# Patient Record
Sex: Female | Born: 1935 | Race: White | Hispanic: No | Smoking: Never smoker
Health system: Southern US, Community
[De-identification: ages and names within clinical notes are randomized; demographics above are authoritative.]

## PROBLEM LIST (undated history)

## (undated) DIAGNOSIS — I10 Essential (primary) hypertension: Secondary | ICD-10-CM

## (undated) DIAGNOSIS — I519 Heart disease, unspecified: Secondary | ICD-10-CM

## (undated) DIAGNOSIS — I951 Orthostatic hypotension: Secondary | ICD-10-CM

## (undated) DIAGNOSIS — R0609 Other forms of dyspnea: Secondary | ICD-10-CM

## (undated) DIAGNOSIS — I340 Nonrheumatic mitral (valve) insufficiency: Secondary | ICD-10-CM

## (undated) DIAGNOSIS — I2510 Atherosclerotic heart disease of native coronary artery without angina pectoris: Secondary | ICD-10-CM

## (undated) DIAGNOSIS — R0600 Dyspnea, unspecified: Secondary | ICD-10-CM

## (undated) DIAGNOSIS — E785 Hyperlipidemia, unspecified: Secondary | ICD-10-CM

## (undated) HISTORY — DX: Nonrheumatic mitral (valve) insufficiency: I34.0

## (undated) HISTORY — DX: Orthostatic hypotension: I95.1

## (undated) HISTORY — DX: Heart disease, unspecified: I51.9

## (undated) HISTORY — DX: Dyspnea, unspecified: R06.00

## (undated) HISTORY — DX: Other forms of dyspnea: R06.09

## (undated) HISTORY — DX: Atherosclerotic heart disease of native coronary artery without angina pectoris: I25.10

## (undated) HISTORY — PX: COLONOSCOPY W/ POLYPECTOMY: SHX1380

## (undated) HISTORY — PX: ECTROPION REPAIR: SHX357

## (undated) HISTORY — DX: Hyperlipidemia, unspecified: E78.5

## (undated) HISTORY — PX: CARDIAC CATHETERIZATION: SHX172

---

## 2000-06-20 ENCOUNTER — Encounter: Admission: RE | Admit: 2000-06-20 | Discharge: 2000-06-20 | Payer: Self-pay | Admitting: Internal Medicine

## 2000-06-20 ENCOUNTER — Encounter: Payer: Self-pay | Admitting: Internal Medicine

## 2000-10-14 ENCOUNTER — Emergency Department (HOSPITAL_COMMUNITY): Admission: EM | Admit: 2000-10-14 | Discharge: 2000-10-14 | Payer: Self-pay

## 2000-10-14 ENCOUNTER — Encounter: Payer: Self-pay | Admitting: Emergency Medicine

## 2000-12-07 ENCOUNTER — Ambulatory Visit (HOSPITAL_COMMUNITY): Admission: RE | Admit: 2000-12-07 | Discharge: 2000-12-07 | Payer: Self-pay | Admitting: Ophthalmology

## 2001-07-21 ENCOUNTER — Encounter (INDEPENDENT_AMBULATORY_CARE_PROVIDER_SITE_OTHER): Payer: Self-pay | Admitting: *Deleted

## 2001-07-21 ENCOUNTER — Observation Stay (HOSPITAL_COMMUNITY): Admission: EM | Admit: 2001-07-21 | Discharge: 2001-07-22 | Payer: Self-pay | Admitting: Emergency Medicine

## 2002-04-30 ENCOUNTER — Ambulatory Visit (HOSPITAL_COMMUNITY): Admission: RE | Admit: 2002-04-30 | Discharge: 2002-04-30 | Payer: Self-pay | Admitting: Gastroenterology

## 2002-04-30 ENCOUNTER — Encounter (INDEPENDENT_AMBULATORY_CARE_PROVIDER_SITE_OTHER): Payer: Self-pay

## 2002-10-24 ENCOUNTER — Ambulatory Visit (HOSPITAL_COMMUNITY): Admission: RE | Admit: 2002-10-24 | Discharge: 2002-10-24 | Payer: Self-pay

## 2004-03-25 ENCOUNTER — Inpatient Hospital Stay (HOSPITAL_COMMUNITY): Admission: EM | Admit: 2004-03-25 | Discharge: 2004-03-30 | Payer: Self-pay | Admitting: Emergency Medicine

## 2004-09-14 ENCOUNTER — Ambulatory Visit: Payer: Self-pay | Admitting: Family Medicine

## 2004-10-15 ENCOUNTER — Ambulatory Visit: Payer: Self-pay | Admitting: Family Medicine

## 2004-11-06 ENCOUNTER — Ambulatory Visit: Payer: Self-pay | Admitting: Family Medicine

## 2004-11-17 ENCOUNTER — Ambulatory Visit: Payer: Self-pay | Admitting: Cardiology

## 2004-12-09 ENCOUNTER — Ambulatory Visit: Payer: Self-pay | Admitting: Cardiology

## 2004-12-21 ENCOUNTER — Ambulatory Visit: Payer: Self-pay | Admitting: Family Medicine

## 2005-03-23 ENCOUNTER — Ambulatory Visit: Payer: Self-pay | Admitting: Family Medicine

## 2005-05-24 ENCOUNTER — Ambulatory Visit: Payer: Self-pay | Admitting: Family Medicine

## 2005-07-06 ENCOUNTER — Ambulatory Visit: Payer: Self-pay | Admitting: Cardiology

## 2005-07-15 ENCOUNTER — Ambulatory Visit: Payer: Self-pay | Admitting: Cardiology

## 2005-07-26 ENCOUNTER — Ambulatory Visit: Payer: Self-pay | Admitting: Family Medicine

## 2005-08-02 ENCOUNTER — Ambulatory Visit: Payer: Self-pay | Admitting: Family Medicine

## 2005-09-08 ENCOUNTER — Ambulatory Visit: Payer: Self-pay | Admitting: Family Medicine

## 2005-10-26 ENCOUNTER — Encounter (INDEPENDENT_AMBULATORY_CARE_PROVIDER_SITE_OTHER): Payer: Self-pay | Admitting: Specialist

## 2005-10-26 ENCOUNTER — Ambulatory Visit (HOSPITAL_COMMUNITY): Admission: RE | Admit: 2005-10-26 | Discharge: 2005-10-26 | Payer: Self-pay | Admitting: Gastroenterology

## 2005-11-09 ENCOUNTER — Ambulatory Visit: Payer: Self-pay | Admitting: Family Medicine

## 2006-02-14 ENCOUNTER — Ambulatory Visit: Payer: Self-pay | Admitting: Family Medicine

## 2006-03-15 ENCOUNTER — Ambulatory Visit: Payer: Self-pay | Admitting: Family Medicine

## 2006-03-18 ENCOUNTER — Ambulatory Visit: Payer: Self-pay | Admitting: Family Medicine

## 2006-04-05 ENCOUNTER — Ambulatory Visit: Payer: Self-pay | Admitting: Family Medicine

## 2006-05-03 ENCOUNTER — Ambulatory Visit: Payer: Self-pay | Admitting: Cardiology

## 2006-05-11 ENCOUNTER — Ambulatory Visit: Payer: Self-pay | Admitting: Family Medicine

## 2006-05-19 ENCOUNTER — Ambulatory Visit: Payer: Self-pay

## 2006-05-25 ENCOUNTER — Ambulatory Visit: Payer: Self-pay | Admitting: Family Medicine

## 2006-06-16 ENCOUNTER — Ambulatory Visit: Payer: Self-pay | Admitting: Family Medicine

## 2006-06-16 ENCOUNTER — Ambulatory Visit: Payer: Self-pay | Admitting: Cardiology

## 2006-06-16 ENCOUNTER — Encounter: Payer: Self-pay | Admitting: Cardiology

## 2006-06-16 ENCOUNTER — Inpatient Hospital Stay (HOSPITAL_COMMUNITY): Admission: EM | Admit: 2006-06-16 | Discharge: 2006-06-17 | Payer: Self-pay | Admitting: Emergency Medicine

## 2006-06-22 ENCOUNTER — Ambulatory Visit: Payer: Self-pay | Admitting: Family Medicine

## 2006-06-30 ENCOUNTER — Ambulatory Visit: Payer: Self-pay | Admitting: Cardiology

## 2006-07-07 ENCOUNTER — Ambulatory Visit: Payer: Self-pay

## 2006-07-07 ENCOUNTER — Ambulatory Visit: Payer: Self-pay | Admitting: Cardiology

## 2006-07-20 ENCOUNTER — Ambulatory Visit: Payer: Self-pay | Admitting: Internal Medicine

## 2006-08-03 ENCOUNTER — Ambulatory Visit: Payer: Self-pay | Admitting: Family Medicine

## 2006-09-26 ENCOUNTER — Ambulatory Visit: Payer: Self-pay | Admitting: Family Medicine

## 2006-11-02 ENCOUNTER — Ambulatory Visit: Payer: Self-pay | Admitting: Cardiology

## 2006-11-02 LAB — CONVERTED CEMR LAB
BUN: 12 mg/dL (ref 6–23)
GFR calc non Af Amer: 64 mL/min
Potassium: 3.7 meq/L (ref 3.5–5.1)

## 2006-11-21 ENCOUNTER — Ambulatory Visit: Payer: Self-pay | Admitting: Family Medicine

## 2006-12-02 ENCOUNTER — Ambulatory Visit: Payer: Self-pay | Admitting: Family Medicine

## 2007-01-06 ENCOUNTER — Ambulatory Visit: Payer: Self-pay | Admitting: Family Medicine

## 2007-02-06 ENCOUNTER — Ambulatory Visit: Payer: Self-pay | Admitting: Family Medicine

## 2007-02-10 ENCOUNTER — Encounter: Admission: RE | Admit: 2007-02-10 | Discharge: 2007-02-10 | Payer: Self-pay | Admitting: Otolaryngology

## 2007-02-15 ENCOUNTER — Ambulatory Visit: Payer: Self-pay | Admitting: Family Medicine

## 2007-02-28 ENCOUNTER — Ambulatory Visit: Payer: Self-pay | Admitting: Family Medicine

## 2007-03-07 ENCOUNTER — Ambulatory Visit: Payer: Self-pay | Admitting: Family Medicine

## 2007-04-02 ENCOUNTER — Emergency Department (HOSPITAL_COMMUNITY): Admission: EM | Admit: 2007-04-02 | Discharge: 2007-04-02 | Payer: Self-pay | Admitting: Emergency Medicine

## 2007-04-04 ENCOUNTER — Ambulatory Visit: Payer: Self-pay | Admitting: Family Medicine

## 2007-04-18 ENCOUNTER — Ambulatory Visit: Payer: Self-pay | Admitting: Cardiology

## 2007-07-18 ENCOUNTER — Ambulatory Visit: Payer: Self-pay | Admitting: Cardiology

## 2007-11-27 ENCOUNTER — Encounter: Admission: RE | Admit: 2007-11-27 | Discharge: 2007-11-27 | Payer: Self-pay | Admitting: Orthopedic Surgery

## 2009-02-18 ENCOUNTER — Ambulatory Visit (HOSPITAL_COMMUNITY): Admission: RE | Admit: 2009-02-18 | Discharge: 2009-02-18 | Payer: Self-pay | Admitting: Orthopedic Surgery

## 2009-08-27 ENCOUNTER — Encounter: Admission: RE | Admit: 2009-08-27 | Discharge: 2009-10-09 | Payer: Self-pay | Admitting: Orthopedic Surgery

## 2009-10-07 ENCOUNTER — Emergency Department (HOSPITAL_COMMUNITY): Admission: EM | Admit: 2009-10-07 | Discharge: 2009-10-07 | Payer: Self-pay | Admitting: Emergency Medicine

## 2010-01-01 ENCOUNTER — Encounter (INDEPENDENT_AMBULATORY_CARE_PROVIDER_SITE_OTHER): Payer: Self-pay | Admitting: *Deleted

## 2010-03-20 DIAGNOSIS — R0609 Other forms of dyspnea: Secondary | ICD-10-CM | POA: Insufficient documentation

## 2010-03-20 DIAGNOSIS — I951 Orthostatic hypotension: Secondary | ICD-10-CM

## 2010-03-20 DIAGNOSIS — E785 Hyperlipidemia, unspecified: Secondary | ICD-10-CM | POA: Insufficient documentation

## 2010-03-20 DIAGNOSIS — I080 Rheumatic disorders of both mitral and aortic valves: Secondary | ICD-10-CM | POA: Insufficient documentation

## 2010-03-20 DIAGNOSIS — I2510 Atherosclerotic heart disease of native coronary artery without angina pectoris: Secondary | ICD-10-CM

## 2010-03-20 DIAGNOSIS — I5022 Chronic systolic (congestive) heart failure: Secondary | ICD-10-CM

## 2010-03-20 DIAGNOSIS — R0989 Other specified symptoms and signs involving the circulatory and respiratory systems: Secondary | ICD-10-CM

## 2010-03-20 DIAGNOSIS — I252 Old myocardial infarction: Secondary | ICD-10-CM

## 2010-03-26 ENCOUNTER — Ambulatory Visit: Payer: Self-pay | Admitting: Cardiology

## 2010-03-30 ENCOUNTER — Encounter: Admission: RE | Admit: 2010-03-30 | Discharge: 2010-03-30 | Payer: Self-pay | Admitting: Gastroenterology

## 2010-06-03 ENCOUNTER — Ambulatory Visit (HOSPITAL_COMMUNITY): Admission: RE | Admit: 2010-06-03 | Discharge: 2010-06-03 | Payer: Self-pay | Admitting: Gastroenterology

## 2010-08-04 ENCOUNTER — Encounter
Admission: RE | Admit: 2010-08-04 | Discharge: 2010-10-08 | Payer: Self-pay | Source: Home / Self Care | Attending: Orthopedic Surgery | Admitting: Orthopedic Surgery

## 2010-10-11 ENCOUNTER — Encounter
Admission: RE | Admit: 2010-10-11 | Discharge: 2010-11-10 | Payer: Self-pay | Source: Home / Self Care | Attending: Orthopedic Surgery | Admitting: Orthopedic Surgery

## 2010-11-10 NOTE — Assessment & Plan Note (Signed)
Summary: f2y  Medications Added HYDRALAZINE HCL 10 MG TABS (HYDRALAZINE HCL) 1 two times a day ADVAIR DISKUS 250-50 MCG/DOSE AEPB (FLUTICASONE-SALMETEROL) 1 puff two times a day VITAMIN C 1000 MG TABS (ASCORBIC ACID) 1 tab two times a day FUROSEMIDE 20 MG TABS (FUROSEMIDE) 1 tab as needed PREDNISONE 5 MG TABS (PREDNISONE) 1 tab once daily ANTIVERT 25 MG TABS (MECLIZINE HCL) 1 tab as needed      Allergies Added: ! PCN ! DEMEROL  Visit Type:  2 yr f/u Primary Provider:  Joette Catching  CC:  pt states when she lays down at night she has some RUQ pain....edema/knees....denies any sob.  History of Present Illness: [name] returns today with her daughter after nearly a three-year absence from the practice.  She's having no symptoms of angina or ischemia. She denies orthopnea PND or edema. She has some mild dyspnea on exertion but really doesn't push herself a hard. She is undergoing gastroenterology evaluation the present time because of abdominal pain. She denies any bleeding or melena.  She has run out of hydralazine. She has a previous ejection fraction around 35%.  Current Medications (verified): 1)  Carvedilol 3.125 Mg Tabs (Carvedilol) .Marland Kitchen.. 1 Tab Two Times A Day 2)  Hydralazine Hcl 10 Mg Tabs (Hydralazine Hcl) .Marland Kitchen.. 1 Tab Three Times A Day 3)  Caltrate 600+d 600-400 Mg-Unit Tabs (Calcium Carbonate-Vitamin D) .Marland Kitchen.. 1 Tab Two Times A Day 4)  Advair Diskus 250-50 Mcg/dose Aepb (Fluticasone-Salmeterol) .Marland Kitchen.. 1 Puff Two Times A Day 5)  Vitamin C 1000 Mg Tabs (Ascorbic Acid) .Marland Kitchen.. 1 Tab Two Times A Day 6)  Furosemide 20 Mg Tabs (Furosemide) .Marland Kitchen.. 1 Tab As Needed 7)  Prednisone 5 Mg Tabs (Prednisone) .Marland Kitchen.. 1 Tab Once Daily 8)  Antivert 25 Mg Tabs (Meclizine Hcl) .Marland Kitchen.. 1 Tab As Needed  Allergies (verified): 1)  ! Pcn 2)  ! Demerol  Past History:  Past Medical History: Last updated: Mar 22, 2010  1. Coronary disease status post non-ST segment elevation MI in 2005.       She has a  bare-metal stent in the right coronary artery.  She has       residual circumflex disease being treated medically.  She is having       no angina or anginal equivalence.   2. Chronic systolic dysfunction.  EF 35% to 45%.   3. Chronic dyspnea on exertion.   4. History of orthostatic hypotension.   5. Hyperlipidemia.   6. Multiple drug allergies including particularly antihypertensives.   7. Moderate mitral regurgitation.      Past Surgical History: Last updated: March 22, 2010 Esophagogastroduodenoscopy. Colonoscopy with polypectomy. Esophagogastroduodenoscopy with biopsy. Ectropion repair of the left lower eyelid  Family History: Last updated: 2010-03-22 Mother died of a ruptured appendix at age 63.  Father died  of cancer at age 28.  She had three brothers, one died at age 46 months, one  died at 54 of lung cancer and one was alive and well.  She has two sisters,  one suffered a stroke in the other has COPD.  Social History: Last updated: Mar 22, 2010  She lives in [place] with her daughter.  She is a retired  Tree surgeon.  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  She has never  smoked cigarettes, does not use alcohol or drugs.  She does not routinely  exercise but is active around her house and yard.  She tries to adhere to a  low fat diet.  Review of Systems  negative other history of present illness  Vital Signs:  Patient profile:   77 year old female Height:      64.5 inches Weight:      133 pounds BMI:     22.56 Pulse rate:   64 / minute Pulse rhythm:   irregular BP sitting:   128 / 82  (left arm) Cuff size:   large  Vitals Entered By: Danielle Rankin, CMA (March 26, 2010 4:43 PM)  Physical Exam  General:  elderly, no acute distress Head:  normocephalic and atraumatic Eyes:  PERRLA/EOM intact; conjunctiva and lids normal. Neck:  Neck supple, no JVD. No masses, thyromegaly or abnormal cervical nodes. Chest Juanantonio Stolar:  no deformities or breast masses noted Lungs:   Clear bilaterally to auscultation and percussion. Heart:  PMI nondisplaced, soft S1-S2, no gallop. His soft systolic murmur at the apex. Msk:  decreased ROM.  decreased ROM.   Pulses:  pulses normal in all 4 extremities Extremities:  No clubbing or cyanosis. Neurologic:  Alert and oriented x 3. Skin:  Intact without lesions or rashes. Psych:  Normal affect.   EKG  Procedure date:  03/26/2010  Findings:      normal sinus rhythm, nonspecific interventricular conduction delay, left axis deviation, previous and her Paulina Muchmore infarct, no change.  Impression & Recommendations:  Problem # 1:  CAD, NATIVE VESSEL (ICD-414.01) Assessment Unchanged  Continue to treat with medical therapy. The following medications were removed from the medication list:    Aspirin 81 Mg Tbec (Aspirin) .Marland Kitchen... Take one tablet by mouth daily Her updated medication list for this problem includes:    Carvedilol 3.125 Mg Tabs (Carvedilol) .Marland Kitchen... 1 tab two times a day  The following medications were removed from the medication list:    Aspirin 81 Mg Tbec (Aspirin) .Marland Kitchen... Take one tablet by mouth daily Her updated medication list for this problem includes:    Carvedilol 3.125 Mg Tabs (Carvedilol) .Marland Kitchen... 1 tab two times a day  Orders: EKG w/ Interpretation (93000)  Problem # 2:  SYSTOLIC HEART FAILURE, CHRONIC (ICD-428.22)  I have renewed her hydralazine a low dose and only twice a day considering her age The following medications were removed from the medication list:    Hydrochlorothiazide 12.5 Mg Caps (Hydrochlorothiazide) .Marland Kitchen... 1 cap once daily    Aspirin 81 Mg Tbec (Aspirin) .Marland Kitchen... Take one tablet by mouth daily Her updated medication list for this problem includes:    Carvedilol 3.125 Mg Tabs (Carvedilol) .Marland Kitchen... 1 tab two times a day    Furosemide 20 Mg Tabs (Furosemide) .Marland Kitchen... 1 tab as needed  The following medications were removed from the medication list:    Hydrochlorothiazide 12.5 Mg Caps  (Hydrochlorothiazide) .Marland Kitchen... 1 cap once daily    Aspirin 81 Mg Tbec (Aspirin) .Marland Kitchen... Take one tablet by mouth daily Her updated medication list for this problem includes:    Carvedilol 3.125 Mg Tabs (Carvedilol) .Marland Kitchen... 1 tab two times a day    Furosemide 20 Mg Tabs (Furosemide) .Marland Kitchen... 1 tab as needed  Problem # 3:  MITRAL REGURGITATION, MODERATE (ICD-396.3) Assessment: Unchanged It sounds mild by exam. No signs of heart failure. Continue current medications.  Problem # 4:  ORTHOSTATIC HYPOTENSION (ICD-458.0) With her history of this, I will treat with very conservative dosages of carvedilol and hydralazine.  Problem # 5:  HYPERLIPIDEMIA (ICD-272.4)  The following medications were removed from the medication list:    Vytorin 10-40 Mg Tabs (Ezetimibe-simvastatin) .Marland Kitchen... 1 tab at bedtime  The following  medications were removed from the medication list:    Vytorin 10-40 Mg Tabs (Ezetimibe-simvastatin) .Marland Kitchen... 1 tab at bedtime  Patient Instructions: 1)  Your physician recommends that you schedule a follow-up appointment in: YEAR WITH DR Fidel Caggiano 2)  Your physician has recommended you make the following change in your medication: DECREASE HYDRALAZINE TO 10 MG two times a day Prescriptions: HYDRALAZINE HCL 10 MG TABS (HYDRALAZINE HCL) 1 two times a day  #60 x 11   Entered by:   Scherrie Bateman, LPN   Authorized by:   Gaylord Shih, MD, [facility]   Signed by:   Scherrie Bateman, LPN on 16/07/9603   Method used:   Faxed to ...       Hospital doctor (retail)       [address]       [place], [place]  [address]       Ph: [phone] or [phone]       Fax: [phone]   RxID:   [id number]

## 2010-11-10 NOTE — Miscellaneous (Signed)
Summary: med update  Clinical Lists Changes  Medications: Added new medication of CARVEDILOL 3.125 MG TABS (CARVEDILOL) 1 tab two times a day - Signed Added new medication of VYTORIN 10-40 MG TABS (EZETIMIBE-SIMVASTATIN) 1 tab at bedtime Added new medication of FOSAMAX 70 MG TABS (ALENDRONATE SODIUM) 1 tab weekly Added new medication of HYDROCHLOROTHIAZIDE 12.5 MG CAPS (HYDROCHLOROTHIAZIDE) 1 cap once daily Added new medication of ASPIRIN 81 MG TBEC (ASPIRIN) Take one tablet by mouth daily Added new medication of HYDRALAZINE HCL 10 MG TABS (HYDRALAZINE HCL) 1 tab three times a day Added new medication of CALTRATE 600+D 600-400 MG-UNIT TABS (CALCIUM CARBONATE-VITAMIN D) 1 tab two times a day Rx of CARVEDILOL 3.125 MG TABS (CARVEDILOL) 1 tab two times a day;  #60 x 1;  Signed;  Entered by: Danielle Rankin, CMA;  Authorized by: Gaylord Shih, MD, [facility];  Method used: Faxed to [facility], [phone] [place], [place], [place]  [address], Ph: [phone] or [phone], Fax: [phone]    Prescriptions: CARVEDILOL 3.125 MG TABS (CARVEDILOL) 1 tab two times a day  #60 x 1   Entered by:   Danielle Rankin, CMA   Authorized by:   Gaylord Shih, MD, [facility]   Signed by:   Danielle Rankin, CMA on 01/01/2010   Method used:   Faxed to ...       Hospital doctor (retail)       [address]       [place], [place]  [address]       Ph: [phone] or [phone]       Fax: [phone]   RxID:   [id number]

## 2010-11-11 ENCOUNTER — Ambulatory Visit: Payer: Medicare Other | Attending: Orthopedic Surgery | Admitting: Physical Therapy

## 2010-11-11 DIAGNOSIS — M25569 Pain in unspecified knee: Secondary | ICD-10-CM | POA: Insufficient documentation

## 2010-11-11 DIAGNOSIS — R5381 Other malaise: Secondary | ICD-10-CM | POA: Insufficient documentation

## 2010-11-11 DIAGNOSIS — IMO0001 Reserved for inherently not codable concepts without codable children: Secondary | ICD-10-CM | POA: Insufficient documentation

## 2010-11-11 DIAGNOSIS — R269 Unspecified abnormalities of gait and mobility: Secondary | ICD-10-CM | POA: Insufficient documentation

## 2010-11-16 ENCOUNTER — Ambulatory Visit: Payer: Medicare Other | Admitting: Physical Therapy

## 2010-11-18 ENCOUNTER — Ambulatory Visit: Payer: Medicare Other | Admitting: Physical Therapy

## 2010-11-25 ENCOUNTER — Other Ambulatory Visit: Payer: Self-pay | Admitting: Emergency Medicine

## 2010-11-25 ENCOUNTER — Emergency Department (HOSPITAL_COMMUNITY)
Admission: EM | Admit: 2010-11-25 | Discharge: 2010-11-25 | Disposition: A | Discharge status: Home / Self Care | Payer: Medicare Other | Attending: Emergency Medicine | Admitting: Emergency Medicine

## 2010-11-25 ENCOUNTER — Emergency Department (HOSPITAL_COMMUNITY): Payer: Medicare Other

## 2010-11-25 DIAGNOSIS — I252 Old myocardial infarction: Secondary | ICD-10-CM | POA: Insufficient documentation

## 2010-11-25 DIAGNOSIS — R0602 Shortness of breath: Secondary | ICD-10-CM | POA: Insufficient documentation

## 2010-11-25 DIAGNOSIS — I509 Heart failure, unspecified: Secondary | ICD-10-CM | POA: Insufficient documentation

## 2010-11-25 DIAGNOSIS — J4489 Other specified chronic obstructive pulmonary disease: Secondary | ICD-10-CM | POA: Insufficient documentation

## 2010-11-25 DIAGNOSIS — H409 Unspecified glaucoma: Secondary | ICD-10-CM | POA: Insufficient documentation

## 2010-11-25 DIAGNOSIS — R079 Chest pain, unspecified: Secondary | ICD-10-CM | POA: Insufficient documentation

## 2010-11-25 DIAGNOSIS — J449 Chronic obstructive pulmonary disease, unspecified: Secondary | ICD-10-CM | POA: Insufficient documentation

## 2010-11-25 DIAGNOSIS — I2510 Atherosclerotic heart disease of native coronary artery without angina pectoris: Secondary | ICD-10-CM | POA: Insufficient documentation

## 2010-11-25 DIAGNOSIS — I10 Essential (primary) hypertension: Secondary | ICD-10-CM | POA: Insufficient documentation

## 2010-11-25 LAB — CK TOTAL AND CKMB (NOT AT ARMC)
CK, MB: 1.3 ng/mL (ref 0.3–4.0)
Relative Index: INVALID (ref 0.0–2.5)
Total CK: 30 U/L (ref 7–177)

## 2010-11-25 MED ORDER — IOHEXOL 300 MG/ML  SOLN
80.0000 mL | Freq: Once | INTRAMUSCULAR | Status: AC | PRN
  Administered 2010-11-25: 80 mL via INTRAVENOUS

## 2010-11-27 ENCOUNTER — Other Ambulatory Visit: Payer: Medicare Other

## 2010-11-27 ENCOUNTER — Encounter: Payer: Self-pay | Admitting: Physician Assistant

## 2010-11-27 ENCOUNTER — Other Ambulatory Visit: Payer: Self-pay | Admitting: Cardiology

## 2010-11-27 ENCOUNTER — Ambulatory Visit (INDEPENDENT_AMBULATORY_CARE_PROVIDER_SITE_OTHER): Payer: Medicare Other | Admitting: Physician Assistant

## 2010-11-27 DIAGNOSIS — I5022 Chronic systolic (congestive) heart failure: Secondary | ICD-10-CM

## 2010-11-27 DIAGNOSIS — R109 Unspecified abdominal pain: Secondary | ICD-10-CM | POA: Insufficient documentation

## 2010-11-27 DIAGNOSIS — R0602 Shortness of breath: Secondary | ICD-10-CM

## 2010-11-27 DIAGNOSIS — R0609 Other forms of dyspnea: Secondary | ICD-10-CM

## 2010-11-27 DIAGNOSIS — I951 Orthostatic hypotension: Secondary | ICD-10-CM

## 2010-11-27 DIAGNOSIS — I214 Non-ST elevation (NSTEMI) myocardial infarction: Secondary | ICD-10-CM

## 2010-11-27 LAB — BASIC METABOLIC PANEL
BUN: 16 mg/dL (ref 6–23)
Chloride: 103 mEq/L (ref 96–112)
Creatinine, Ser: 1.1 mg/dL (ref 0.4–1.2)
GFR: 51.26 mL/min — ABNORMAL LOW (ref >60.00)
Potassium: 3.8 mEq/L (ref 3.5–5.1)

## 2010-12-02 NOTE — Assessment & Plan Note (Signed)
Summary: Post ED visit  Medications Added FUROSEMIDE 20 MG TABS (FUROSEMIDE) 1 tab daily FUROSEMIDE 20 MG TABS (FUROSEMIDE) 1 tab daily TAKE EXTRA ON DAYS WHEN WEIGHT IS INCREASED BY 3 LB OR MORE IN ONE DAY ROBAXIN 500 MG TABS (METHOCARBAMOL) as needed ANTIVERT 12.5 MG TABS (MECLIZINE HCL) as needed PROMETHAZINE HCL 25 MG TABS (PROMETHAZINE HCL) as needed TRAMADOL HCL 50 MG TABS (TRAMADOL HCL) as needed NITROSTAT 0.4 MG SUBL (NITROGLYCERIN) 1 tablet under tongue at onset of chest pain; you may repeat every 5 minutes for up to 3 doses. ASPIRIN 81 MG TBEC (ASPIRIN) Take one tablet by mouth daily        Primary Provider:  Joette Catching   History of Present Illness: Primary Cardiologist:  Dr. Valera Castle  [name] is an 77 yo female with a h/o CAD, status post NSTEMI in 2005 treated with a BMS to the proximal RCA, dilated cardiomyopathy with an EF of 35-45%, chronic systolic heart failure, moderate mitral regurgitation and orthostatic hypotension who presented to the emergency room on February 15 with chest pain and shortness of breath.  Her cardiac markers were negative x2.  Her BNP was elevated at 609.  CT angiogram of the chest demonstrated no pulmonary embolism, cardiomegaly and vascular congestion suspicious for early edema, moderate bilateral pleural effusions with compressive atelectasis, positive coronary calcification, findings consistent with pulmonary arterial hypertension and thoracic and lumbar spine compression fractures.  She was treated with IV Lasix with diuresis and discharged home with plans for followup today.  The patient states that she awoke suddenly around 3 AM the night that she went to the emergency room. She was short of breath.  This was increased from her baseline.  She denies any chest discomfort, arm or jaw discomfort.  She denies syncope.  Since she went to the emergency room, she's felt better without shortness of breath.  Today when she walked in the office  she felt more short of breath than normal.  Of note, she was placed back on furosemide 20 mg a day when she went to  the emergency room.  Current Medications (verified): 1)  Carvedilol 3.125 Mg Tabs (Carvedilol) .Marland Kitchen.. 1 Tab Two Times A Day 2)  Hydralazine Hcl 10 Mg Tabs (Hydralazine Hcl) .Marland Kitchen.. 1 Two Times A Day 3)  Caltrate 600+d 600-400 Mg-Unit Tabs (Calcium Carbonate-Vitamin D) .Marland Kitchen.. 1 Tab Two Times A Day 4)  Advair Diskus 250-50 Mcg/dose Aepb (Fluticasone-Salmeterol) .Marland Kitchen.. 1 Puff Two Times A Day 5)  Vitamin C 1000 Mg Tabs (Ascorbic Acid) .Marland Kitchen.. 1 Tab Two Times A Day 6)  Furosemide 20 Mg Tabs (Furosemide) .Marland Kitchen.. 1 Tab Daily 7)  Prednisone 5 Mg Tabs (Prednisone) .Marland Kitchen.. 1 Tab Once Daily 8)  Antivert 25 Mg Tabs (Meclizine Hcl) .Marland Kitchen.. 1 Tab As Needed 9)  Robaxin 500 Mg Tabs (Methocarbamol) .... As Needed 10)  Antivert 12.5 Mg Tabs (Meclizine Hcl) .... As Needed 11)  Promethazine Hcl 25 Mg Tabs (Promethazine Hcl) .... As Needed 12)  Tramadol Hcl 50 Mg Tabs (Tramadol Hcl) .... As Needed  Allergies: 1)  ! Pcn 2)  ! Demerol  Past History:  Past Medical History:  1. Coronary disease status post non-ST segment elevation MI in 2005.       She has a bare-metal stent in the right coronary artery.  She has       residual circumflex 90% being treated medically; RI 30-40%; proximal LAD 20-30%, 30-40%  2. Chronic systolic dysfunction.  EF 35% to 45%.  3. Chronic dyspnea on exertion.   4. History of orthostatic hypotension.   5. Hyperlipidemia.   6. Multiple drug allergies including particularly antihypertensives.   7. Moderate mitral regurgitation.       a.  Echo September 2007: EF 35-45%; mild LVH; mild-moderate AI; moderate MR; mild LAE  Social History: Reviewed history from 03/20/2010 and no changes required.  She lives in [place] with her daughter.  She is a retired  Tree surgeon.  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  She has never  smoked cigarettes, does not use alcohol or drugs.  She  does not routinely  exercise but is active around her house and yard.  She tries to adhere to a  low fat diet.  Review of Systems       She notes that she is to see a gastroenterologist next week.  She apparently had a recent abdominal CT that demonstrates an enlarged liver.  Otherwise, as per  the HPI.  All other systems reviewed and negative.   Vital Signs:  Patient profile:   77 year old female Height:      64.5 inches Weight:      118 pounds BMI:     20.01 O2 Sat:      98 % on Room air Pulse rate:   71 / minute Resp:     16 per minute BP sitting:   132 / 77  (right arm)  Vitals Entered By: Marrion Coy, CNA (November 27, 2010 8:44 AM)  O2 Flow:  Room air  Physical Exam  General:  Well nourished, well developed, in no acute distress HEENT: normal Neck: no JVD Cardiac:  normal S1, S2; RRR; no murmur Lungs:  clear to auscultation bilaterally, no wheezing, rhonchi or rales Abd: soft; RUQ tenderness with palpation Ext: no edema Skin: warm and dry Neuro:  CNs 2-12 intact, no focal abnormalities noted    EKG  Procedure date:  11/27/2010  Findings:      normal sinus rhythm Heart rate 71 left axis deviation  Interventricular conduction delay  Impression & Recommendations:  Problem # 1:  SYSTOLIC HEART FAILURE, CHRONIC (ICD-428.22) She appears optivolemic today.  We checked her oxygen saturation and it was 98% on room air.  Her daughter states that she has episodes where she breathes quickly.  She appears to have a component of anxiety contributing to her tachypnea.  Dr. Daleen Squibb also saw the patient today. We agreed that her volume appears stable.  She's had a problem with orthostatic hypotension the past.  At this point, we plan to keep her on her same dose of Lasix.  She's been given instructions to weigh herself daily.  If she gains 3 pounds in one day or becomes more short of breath she can take an extra lasix and call us.  We will check a basic metabolic panel today.  We  will also check a BNP.  I will bring her back in followup with Dr. Daleen Squibb in the next 2 weeks in followup.  Problem # 2:  CAD, NATIVE VESSEL (ICD-414.01) She is not complaining of chest pain.  However, she has had acute onset of congestive heart failure recently.  The allergy this is uncertain.  Given her history of coronary disease, I will set her up for a flexible sigmoidoscopy scan  Problem # 3:  ABDOMINAL PAIN OTHER SPECIFIED SITE (ICD-789.09) As noted above, she has an appointment with gastroenterology next week.  Apparently a CT scan demonstrated hepatomegaly.  Other Orders: EKG w/ Interpretation (93000) TLB-BMP (Basic Metabolic Panel-BMET) (80048-METABOL) TLB-BNP (B-Natriuretic Peptide) (83880-BNPR) Nuclear Stress Test (Nuc Stress Test)  Patient Instructions: 1)  Your physician recommends that you schedule a follow-up appointment in: 12/14/10 @ 9:30 to see Dr. Daleen Squibb as per Tereso Newcomer, PA-C. 2)  Your physician recommends that you return for lab work in: Today BMET 428.22, 786.05, BNP 428.22, 786.05.  3)  Your physician has requested that you have anLexiscan myoview.  For further information please visit [email or web address]  Please follow instruction sheet, as given. 4)  Your physician has recommended you make the following change in your medication: START ASPIRIN 81 MG ONCE DAILY. 5)  Your physician recommends that you weigh, daily, at the same time every day, and in the same amount of clothing.  Please record your daily weights on the handout provided and bring it to your next appointment. IF YOUR WEIGHT IS UP 3 LB OR MORE IN ONE DAY TAKE EXTRA LASIX (FUROSEMIDE) AND CALL OUR OFFICE [phone] TO ADVISE TO Santosha Jividen, PA-C OF WEIGHT INCREASE. Prescriptions: FUROSEMIDE 20 MG TABS (FUROSEMIDE) 1 tab daily TAKE EXTRA ON DAYS WHEN WEIGHT IS INCREASED BY 3 LB OR MORE IN ONE DAY  #30 x 11   Entered by:   Danielle Rankin, CMA   Authorized by:   Tereso Newcomer PA-C   Signed by:   Danielle Rankin, CMA on  11/27/2010   Method used:   Faxed to ...       Hospital doctor (retail)       [address]       [place], [place]  [address]       Ph: [phone] or [phone]       Fax: [phone]   RxID:   [id number] NITROSTAT 0.4 MG SUBL (NITROGLYCERIN) 1 tablet under tongue at onset of chest pain; you may repeat every 5 minutes for up to 3 doses.  #25 x 6   Entered by:   Danielle Rankin, CMA   Authorized by:   Tereso Newcomer PA-C   Signed by:   Danielle Rankin, CMA on 11/27/2010   Method used:   Faxed to ...       Hospital doctor (retail)       [address]       [place], [place]  [address]       Ph: [phone] or [phone]       Fax: [phone]   RxID:   [id number]  I have personally reviewed the prescriptions today for accuracy.Tereso Newcomer PA-C  November 27, 2010 11:12 AM

## 2010-12-08 ENCOUNTER — Ambulatory Visit (HOSPITAL_COMMUNITY): Payer: Medicare Other | Attending: Cardiovascular Disease

## 2010-12-08 ENCOUNTER — Encounter: Payer: Self-pay | Admitting: Cardiovascular Disease

## 2010-12-08 DIAGNOSIS — R079 Chest pain, unspecified: Secondary | ICD-10-CM

## 2010-12-08 DIAGNOSIS — R0602 Shortness of breath: Secondary | ICD-10-CM

## 2010-12-08 DIAGNOSIS — I214 Non-ST elevation (NSTEMI) myocardial infarction: Secondary | ICD-10-CM | POA: Insufficient documentation

## 2010-12-08 DIAGNOSIS — I2510 Atherosclerotic heart disease of native coronary artery without angina pectoris: Secondary | ICD-10-CM

## 2010-12-08 DIAGNOSIS — R9431 Abnormal electrocardiogram [ECG] [EKG]: Secondary | ICD-10-CM

## 2010-12-10 LAB — BLOOD GAS, ARTERIAL
Drawn by: 257701
O2 Content: 2 L/min
pCO2 arterial: 29.2 mmHg — ABNORMAL LOW (ref 35.0–45.0)
pH, Arterial: 7.474 — ABNORMAL HIGH (ref 7.350–7.400)
pO2, Arterial: 74.6 mmHg — ABNORMAL LOW (ref 80.0–100.0)

## 2010-12-10 LAB — HEPATIC FUNCTION PANEL
ALT: 9 U/L (ref 0–35)
AST: 16 U/L (ref 0–37)
Albumin: 3.5 g/dL (ref 3.5–5.2)
Alkaline Phosphatase: 40 U/L (ref 39–117)
Indirect Bilirubin: 0.8 mg/dL (ref 0.3–0.9)
Total Protein: 5.8 g/dL — ABNORMAL LOW (ref 6.0–8.3)

## 2010-12-10 LAB — BASIC METABOLIC PANEL
BUN: 14 mg/dL (ref 6–23)
CO2: 22 mEq/L (ref 19–32)
GFR calc Af Amer: >60 mL/min (ref >60)
GFR calc non Af Amer: 59 mL/min — ABNORMAL LOW (ref >60)
Glucose, Bld: 97 mg/dL (ref 70–99)
Sodium: 136 mEq/L (ref 135–145)

## 2010-12-10 LAB — BRAIN NATRIURETIC PEPTIDE: Pro B Natriuretic peptide (BNP): 609 pg/mL — ABNORMAL HIGH (ref 0.0–100.0)

## 2010-12-14 ENCOUNTER — Ambulatory Visit (INDEPENDENT_AMBULATORY_CARE_PROVIDER_SITE_OTHER): Payer: Medicare Other | Admitting: Cardiology

## 2010-12-14 ENCOUNTER — Encounter: Payer: Self-pay | Admitting: Cardiology

## 2010-12-14 ENCOUNTER — Other Ambulatory Visit: Payer: Self-pay | Admitting: Cardiology

## 2010-12-14 DIAGNOSIS — I252 Old myocardial infarction: Secondary | ICD-10-CM

## 2010-12-14 DIAGNOSIS — I2510 Atherosclerotic heart disease of native coronary artery without angina pectoris: Secondary | ICD-10-CM

## 2010-12-14 DIAGNOSIS — I5022 Chronic systolic (congestive) heart failure: Secondary | ICD-10-CM

## 2010-12-14 LAB — BASIC METABOLIC PANEL
Calcium: 9.2 mg/dL (ref 8.4–10.5)
Creatinine, Ser: 1.1 mg/dL (ref 0.4–1.2)
GFR: 48.16 mL/min — ABNORMAL LOW (ref >60.00)
Sodium: 139 mEq/L (ref 135–145)

## 2010-12-17 NOTE — Assessment & Plan Note (Addendum)
Summary: Cardiology Nuclear Testing  Nuclear Med Background Indications for Stress Test: Evaluation for Ischemia, Stent Patency, Post Hospital  Indications Comments: 11/25/10 [facility]-ED: SOB/CP/(-) enzymes.  History: Abnormal EKG, COPD, Echo, Heart Catheterization, Myocardial Infarction, Stents  History Comments: '05 NSTEMI>Cath> Stent RCA, Residual 90% CFX. 9/07 Echo: EF=35-45%.  Symptoms: Chest Pain, DOE, Palpitations, SOB    Nuclear Pre-Procedure Cardiac Risk Factors: Family History - CAD, Hypertension, Lipids Caffeine/Decaff Intake: None NPO After: 6:00 PM Lungs: clear IV 0.9% NS with Angio Cath: 20g     IV Site: R Antecubital IV Started by: Irean Hong, RN Chest Size (in) 36     Cup Size B     Height (in): 64.5 Weight (lb): 120 BMI: 20.35 Tech Comments: Took carvedilol this am.  Nuclear Med Study 1 or 2 day study:  1 day     Stress Test Type:  Eugenie Birks Reading MD:  Charlton Haws, MD     Referring MD:  T.Wall Resting Radionuclide:  Technetium 47m Tetrofosmin     Resting Radionuclide Dose:  11 mCi  Stress Radionuclide:  Technetium 9m Tetrofosmin     Stress Radionuclide Dose:  33 mCi   Stress Protocol  Max Systolic BP: 133 mm Hg Lexiscan: 0.4 mg   Stress Test Technologist:  Milana Na, EMT-P     Nuclear Technologist:  Doyne Keel, CNMT  Rest Procedure  Myocardial perfusion imaging was performed at rest 45 minutes following the intravenous administration of Technetium 12m Tetrofosmin.  Stress Procedure  The patient received IV Lexiscan 0.4 mg over 15-seconds.  Technetium 36m Tetrofosmin injected at 30-seconds.  There were no significant changes and occ pvcs with infusion.  Quantitative spect images were obtained after a 45 minute delay.  QPS Raw Data Images:  Normal; no motion artifact; normal heart/lung ratio. Stress Images:  Decreased apical counts Rest Images:  Decreased apical counts Subtraction (SDS):  SDS 6 abnormal in apex Transient Ischemic Dilatation:   .98  (Normal <1.22)  Lung/Heart Ratio:  .30  (Normal <0.45)  Quantitative Gated Spect Images QGS EDV:  199 ml QGS ESV:  112 ml QGS EF:  44 % QGS cine images:  Diffuse hypokinesis  Findings Low risk nuclear study Evidence for anterior (septal apical) ischemia   Evidence for LV Dysfunction LV Dysfunction    Overall Impression  Exercise Capacity: Lexiscan with no exercise. BP Response: Normal blood pressure response. Clinical Symptoms: Dyspnea ECG Impression: No significant ST segment change suggestive of ischemia. Overall Impression: Small area of apical ischemia. Diffuse hypokinesis and decreased EF  Appended Document: Cardiology Nuclear Testing low risk study, continue medical therapy, followup with Scott or I in couple of weeks  Appended Document: Cardiology Nuclear Testing Pt daughter is aware of results and have previously scheduled appt with Dr. Daleen Squibb on 12/14/10 Mylo Red RN

## 2010-12-22 NOTE — Assessment & Plan Note (Signed)
Summary: 2 wk f/u w/TW as per SW/TW...Marland Kitchencmf      Allergies Added:   Visit Type:  2 wk follow up Primary Provider:  Joette Catching  CC:  pt says she is feeling much better today.Marland Kitchen  History of Present Illness: Mrs Beckers  returns for close followup of her IHD and CSCHF. Please see recent extensive note. Her weight is stable with increase in Lasix. A stress Myoview was low risk...Marland KitchenMarland KitchenMarland Kitchenmedical therapy. No orthopnea or increased edema.  Current Medications (verified): 1)  Carvedilol 3.125 Mg Tabs (Carvedilol) .Marland Kitchen.. 1 Tab Two Times A Day 2)  Hydralazine Hcl 10 Mg Tabs (Hydralazine Hcl) .Marland Kitchen.. 1 Two Times A Day 3)  Caltrate 600+d 600-400 Mg-Unit Tabs (Calcium Carbonate-Vitamin D) .Marland Kitchen.. 1 Tab Two Times A Day 4)  Advair Diskus 250-50 Mcg/dose Aepb (Fluticasone-Salmeterol) .Marland Kitchen.. 1 Puff Two Times A Day 5)  Vitamin C 1000 Mg Tabs (Ascorbic Acid) .Marland Kitchen.. 1 Tab Two Times A Day 6)  Furosemide 20 Mg Tabs (Furosemide) .Marland Kitchen.. 1 Tab Daily Take Extra On Days When Weight Is Increased By 3 Lb or More in One Day 7)  Prednisone 5 Mg Tabs (Prednisone) .Marland Kitchen.. 1 Tab Once Daily 8)  Robaxin 500 Mg Tabs (Methocarbamol) .... As Needed 9)  Antivert 12.5 Mg Tabs (Meclizine Hcl) .... As Needed 10)  Promethazine Hcl 25 Mg Tabs (Promethazine Hcl) .... As Needed 11)  Tramadol Hcl 50 Mg Tabs (Tramadol Hcl) .... As Needed 12)  Nitrostat 0.4 Mg Subl (Nitroglycerin) .Marland Kitchen.. 1 Tablet Under Tongue At Onset of Chest Pain; You May Repeat Every 5 Minutes For Up To 3 Doses. 13)  Aspirin 81 Mg Tbec (Aspirin) .... Take One Tablet By Mouth Daily  Allergies (verified): 1)  ! Pcn 2)  ! Demerol  Past History:  Past Medical History: Last updated: 11/27/2010  1. Coronary disease status post non-ST segment elevation MI in 2005.       She has a bare-metal stent in the right coronary artery.  She has       residual circumflex 90% being treated medically; RI 30-40%; proximal LAD 20-30%, 30-40%  2. Chronic systolic dysfunction.  EF 35% to 45%.   3.  Chronic dyspnea on exertion.   4. History of orthostatic hypotension.   5. Hyperlipidemia.   6. Multiple drug allergies including particularly antihypertensives.   7. Moderate mitral regurgitation.       a.  Echo September 2007: EF 35-45%; mild LVH; mild-moderate AI; moderate MR; mild LAE  Past Surgical History: Last updated: 2010/04/17 Esophagogastroduodenoscopy. Colonoscopy with polypectomy. Esophagogastroduodenoscopy with biopsy. Ectropion repair of the left lower eyelid  Family History: Last updated: 04-17-10 Mother died of a ruptured appendix at age 55.  Father died  of cancer at age 26.  She had three brothers, one died at age 55 months, one  died at 81 of lung cancer and one was alive and well.  She has two sisters,  one suffered a stroke in the other has COPD.  Social History: Last updated: 04-17-2010  She lives in [place] with her daughter.  She is a retired  Tree surgeon.  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  She has never  smoked cigarettes, does not use alcohol or drugs.  She does not routinely  exercise but is active around her house and yard.  She tries to adhere to a  low fat diet.  Review of Systems       negative other than HPI  Vital Signs:  Patient profile:  77 year old female Height:      64.5 inches Weight:      119.25 pounds BMI:     20.23 Pulse rate:   60 / minute Resp:     18 per minute BP sitting:   118 / 72  (left arm) Cuff size:   large  Vitals Entered By: Celestia Khat, CMA (December 14, 2010 10:06 AM)  Physical Exam  General:  NAD Head:  normocephalic and atraumatic Neck:  NO JVD Lungs:  Dullness in bases less than 1/4 up, no rales Heart:  No S3, RRR Msk:  decreased ROM.  decreased ROM.   Pulses:  pulses normal in all 4 extremities Extremities:  MIN EDEMA Neurologic:  Alert and oriented x 3. Skin:  Intact without lesions or rashes. Psych:  Normal affect.   Impression & Recommendations:  Problem # 1:  CAD, NATIVE VESSEL  (ICD-414.01) Assessment Unchanged  Her updated medication list for this problem includes:    Carvedilol 3.125 Mg Tabs (Carvedilol) .Marland Kitchen... 1 tab two times a day    Nitrostat 0.4 Mg Subl (Nitroglycerin) .Marland Kitchen... 1 tablet under tongue at onset of chest pain; you may repeat every 5 minutes for up to 3 doses.    Aspirin 81 Mg Tbec (Aspirin) .Marland Kitchen... Take one tablet by mouth daily  Her updated medication list for this problem includes:    Carvedilol 3.125 Mg Tabs (Carvedilol) .Marland Kitchen... 1 tab two times a day    Nitrostat 0.4 Mg Subl (Nitroglycerin) .Marland Kitchen... 1 tablet under tongue at onset of chest pain; you may repeat every 5 minutes for up to 3 doses.    Aspirin 81 Mg Tbec (Aspirin) .Marland Kitchen... Take one tablet by mouth daily  Problem # 2:  SYSTOLIC HEART FAILURE, CHRONIC (ICD-428.22) Assessment: Improved  Her updated medication list for this problem includes:    Carvedilol 3.125 Mg Tabs (Carvedilol) .Marland Kitchen... 1 tab two times a day    Furosemide 20 Mg Tabs (Furosemide) .Marland Kitchen... 1 tab daily take extra on days when weight is increased by 3 lb or more in one day    Nitrostat 0.4 Mg Subl (Nitroglycerin) .Marland Kitchen... 1 tablet under tongue at onset of chest pain; you may repeat every 5 minutes for up to 3 doses.    Aspirin 81 Mg Tbec (Aspirin) .Marland Kitchen... Take one tablet by mouth daily  Orders: TLB-BMP (Basic Metabolic Panel-BMET) (80048-METABOL)  Her updated medication list for this problem includes:    Carvedilol 3.125 Mg Tabs (Carvedilol) .Marland Kitchen... 1 tab two times a day    Furosemide 20 Mg Tabs (Furosemide) .Marland Kitchen... 1 tab daily take extra on days when weight is increased by 3 lb or more in one day    Nitrostat 0.4 Mg Subl (Nitroglycerin) .Marland Kitchen... 1 tablet under tongue at onset of chest pain; you may repeat every 5 minutes for up to 3 doses.    Aspirin 81 Mg Tbec (Aspirin) .Marland Kitchen... Take one tablet by mouth daily  Patient Instructions: 1)  Your physician recommends that you schedule a follow-up appointment in: 4-6 weeks with Dr. Daleen Squibb 2)  Your  physician recommends that you continue on your current medications as directed. Please refer to the Current Medication list given to you today.

## 2010-12-30 ENCOUNTER — Encounter: Payer: Self-pay | Admitting: Cardiology

## 2011-01-11 ENCOUNTER — Ambulatory Visit (INDEPENDENT_AMBULATORY_CARE_PROVIDER_SITE_OTHER): Payer: Medicare Other | Admitting: Cardiology

## 2011-01-11 ENCOUNTER — Encounter: Payer: Self-pay | Admitting: Cardiology

## 2011-01-11 DIAGNOSIS — I080 Rheumatic disorders of both mitral and aortic valves: Secondary | ICD-10-CM

## 2011-01-11 DIAGNOSIS — I5022 Chronic systolic (congestive) heart failure: Secondary | ICD-10-CM

## 2011-01-11 DIAGNOSIS — I2510 Atherosclerotic heart disease of native coronary artery without angina pectoris: Secondary | ICD-10-CM

## 2011-01-11 LAB — COMPREHENSIVE METABOLIC PANEL
Alkaline Phosphatase: 44 U/L (ref 39–117)
BUN: 22 mg/dL (ref 6–23)
CO2: 28 mEq/L (ref 19–32)
Calcium: 8.9 mg/dL (ref 8.4–10.5)
GFR calc non Af Amer: 53 mL/min — ABNORMAL LOW (ref >60)
Glucose, Bld: 137 mg/dL — ABNORMAL HIGH (ref 70–99)
Potassium: 4 mEq/L (ref 3.5–5.1)
Total Protein: 6.5 g/dL (ref 6.0–8.3)

## 2011-01-11 LAB — CBC
HCT: 42.3 % (ref 36.0–46.0)
Hemoglobin: 14.4 g/dL (ref 12.0–15.0)
MCHC: 34 g/dL (ref 30.0–36.0)
RBC: 4.28 MIL/uL (ref 3.87–5.11)
RDW: 13.8 % (ref 11.5–15.5)

## 2011-01-11 LAB — DIFFERENTIAL
Basophils Absolute: 0 10*3/uL (ref 0.0–0.1)
Basophils Relative: 0 % (ref 0–1)
Eosinophils Absolute: 0.1 10*3/uL (ref 0.0–0.7)
Monocytes Relative: 6 % (ref 3–12)
Neutro Abs: 9.1 10*3/uL — ABNORMAL HIGH (ref 1.7–7.7)
Neutrophils Relative %: 89 % — ABNORMAL HIGH (ref 43–77)

## 2011-01-11 LAB — URINALYSIS, ROUTINE W REFLEX MICROSCOPIC
Protein, ur: NEGATIVE mg/dL
Urobilinogen, UA: 0.2 mg/dL (ref 0.0–1.0)

## 2011-01-11 LAB — LIPASE, BLOOD: Lipase: 16 U/L (ref 11–59)

## 2011-01-11 LAB — URINE CULTURE

## 2011-01-11 NOTE — Patient Instructions (Signed)
Your physician recommends that you schedule a follow-up appointment in: 6 months with Dr. Wall  

## 2011-01-11 NOTE — Progress Notes (Signed)
   Patient ID: MELADY CHOW, female    DOB: 05/27/1924, 77 y.o.   MRN: [id number]  HPI Mrs Simonet returns for E and M of her CAD  And CSCHF. Her weight has been stable and her DOE unchanged. Very compliant with her meds and diet. No CP or angina.    Review of Systems  All other systems reviewed and are negative.      Physical Exam  Constitutional: She is oriented to person, place, and time. She appears well-developed and well-nourished.       Severly kyphotic  HENT:  Head: Normocephalic and atraumatic.  Eyes: EOM are normal. Pupils are equal, round, and reactive to light.  Neck: Normal range of motion. No JVD present. No tracheal deviation present. No thyromegaly present.  Cardiovascular: Regular rhythm and normal heart sounds.  Exam reveals no gallop.   No murmur heard.      Slow RR Diminished pulses in LE's  Pulmonary/Chest: Effort normal and breath sounds normal.  Abdominal: Soft. Bowel sounds are normal.  Musculoskeletal:       Decreased ROM  Neurological: She is alert and oriented to person, place, and time.  Skin: Skin is dry.  Psychiatric: She has a normal mood and affect. Her behavior is normal. Judgment and thought content normal.

## 2011-02-08 ENCOUNTER — Ambulatory Visit: Payer: Medicare Other | Attending: Family Medicine | Admitting: Physical Therapy

## 2011-02-08 DIAGNOSIS — R5381 Other malaise: Secondary | ICD-10-CM | POA: Insufficient documentation

## 2011-02-08 DIAGNOSIS — M25569 Pain in unspecified knee: Secondary | ICD-10-CM | POA: Insufficient documentation

## 2011-02-08 DIAGNOSIS — IMO0001 Reserved for inherently not codable concepts without codable children: Secondary | ICD-10-CM | POA: Insufficient documentation

## 2011-02-08 DIAGNOSIS — M6281 Muscle weakness (generalized): Secondary | ICD-10-CM | POA: Insufficient documentation

## 2011-02-11 ENCOUNTER — Ambulatory Visit: Payer: Medicare Other | Attending: Orthopedic Surgery | Admitting: Physical Therapy

## 2011-02-11 DIAGNOSIS — IMO0001 Reserved for inherently not codable concepts without codable children: Secondary | ICD-10-CM | POA: Insufficient documentation

## 2011-02-11 DIAGNOSIS — R5381 Other malaise: Secondary | ICD-10-CM | POA: Insufficient documentation

## 2011-02-11 DIAGNOSIS — M25569 Pain in unspecified knee: Secondary | ICD-10-CM | POA: Insufficient documentation

## 2011-02-11 DIAGNOSIS — R269 Unspecified abnormalities of gait and mobility: Secondary | ICD-10-CM | POA: Insufficient documentation

## 2011-02-15 ENCOUNTER — Ambulatory Visit: Payer: Medicare Other | Admitting: Physical Therapy

## 2011-02-18 ENCOUNTER — Ambulatory Visit: Payer: Medicare Other | Admitting: Physical Therapy

## 2011-02-22 ENCOUNTER — Ambulatory Visit: Payer: Medicare Other | Admitting: Physical Therapy

## 2011-02-23 NOTE — Assessment & Plan Note (Signed)
[facility]                            CARDIOLOGY OFFICE NOTE   NAME:[name]                      MRN:          [id number]  DATE:04/18/2007                            DOB:          September 06, 1924    [name] returns today for further management of the following  issues:  1. Coronary artery disease, status post non-ST segment elevation      myocardial infarction in 2005.  Bare metal stent to the right      coronary artery.  2. Residual circumflex disease, treated medically.  3. Chronic systolic dysfunction.  Ejection fraction 35%-45%.  4. Chronic dyspnea on exertion.  5. History of orthostatic hypertension.  6. Hyperlipidemia.  7. Multiple drug allergies, particularly antihypertensives.  8. Moderate mitral regurgitation.   She recently had an episode where her blood pressure increased to 190,  and she was dizzy.  She denied any shortness of breath or chest pain.  She was taken to the [facility] ER on April 02, 2007 by EMS.  Per her  records, she must have received an intravenous dose of Lasix en route  that was not recorded.  She diuresed several liters.  Her chest x-ray  did not show any heart failure.  Her blood work was unremarkable.  A BNP  was not obtained.  Cardiac markers were negative.   She is now on Lasix, and she thinks the dose os 20 mg a day.  This was  started by Dr. Lysbeth Galas.  The rest of her medicines are unchanged and are  labeled in the chart.   She feels good today.   PHYSICAL EXAMINATION:  VITAL SIGNS:  Her blood pressure is 134/70, her  pulse is 57 and regular, her weight is 154.  HEENT:  Normocephalic, atraumatic.  PERRLA.  Extraocular movements  intact.  Her left eyelid is a little bit droopy.  NECK:  Supple.  Carotids are full.  There is no JVD.  LUNGS:  Clear, except for a few rhonchi that clear with coughing.  HEART:  Reveals a slightly displaced PMI.  She has a systolic murmur at  the apex.  There is no S3  gallop.  ABDOMEN:  Soft.  EXTREMITIES:  Reveal no edema.  Pulses are intact.  The area on her left  lateral leg is now healed (see my last note).  NEUROLOGIC:  Intact.   Electrocardiogram shows interventricular conduction delay, with sinus  rhythm.  There has been no changed.   ASSESSMENT AND PLAN:  I have reviewed [name]' emergency room  records and reviewed those with her as well.  I have made no changes in  her program.  I have advised her that if she starts to develop lower  extremity swelling, or if her blood pressure increases, to call us and  we will go up on her furosemide or Lasix.  I would like to see her back  in 3 months.  She seems more brittle than usual.     Maisie Fus C. Daleen Squibb, MD, [facility]  Electronically Signed    TCW/MedQ  DD: 04/18/2007  DT: 04/19/2007  Job #: [id number]   cc:   [name], M.D.

## 2011-02-23 NOTE — Assessment & Plan Note (Signed)
[facility]                            CARDIOLOGY OFFICE NOTE   NAME:[name], [name]                      MRN:          [id number]  DATE:07/18/2007                            DOB:          1924-02-05    Ms. Knotts returns today for management of the following issues:  1. Coronary disease status post non-ST segment elevation MI in 2005.      She has a bare-metal stent in the right coronary artery.  She has      residual circumflex disease being treated medically.  She is having      no angina or anginal equivalence.  2. Chronic systolic dysfunction.  EF 35% to 45%.  3. Chronic dyspnea on exertion.  4. History of orthostatic hypotension.  5. Hyperlipidemia.  6. Multiple drug allergies including particularly antihypertensives.  7. Moderate mitral regurgitation.   Other than just not sleeping well, she is doing well.  I think she takes  cat naps during the day per her daughter, which makes her roam the  house at night.   Her last objective assessment for coronary disease was May 19, 2006.  EF 53%, moderate dilated LV, fixed anterior wall defect, probably breast  attenuation, no obvious ischemia.   Her medications are unchanged since last visit.  She is on Lasix 20 mg a  day instead of hydrochlorothiazide.  Her blood work is being followed by  Dr. Lysbeth Galas.   Her blood pressure today is 121/78.  Pulse 61 and regular.  Her electrocardiogram shows sinus rhythm with nonspecific  interventricular conduction delay, unchanged from earlier this year.  HEENT:  Normocephalic and atraumatic.  PERRLA.  Extraocular movements  intact.  Sclerae clear.  Facial symmetry is normal.  Carotid upstrokes are equal bilaterally without bruits.  LUNGS:  Clear.  HEART:  Reveals a regular rate and rhythm.  No gallop.  A soft systolic  murmur at the apex.  ABDOMINAL EXAM:  Soft.  EXTREMITIES:  Reveal no edema.  Pulses are present.  She has some  varicose veins.  No sign  of DVT.  NEUROLOGIC:  Exam is intact.   I had a nice chat with Ms. Rohrman and her daughter.  I have made no  changes in her program.  I think we will look leave things as they are,  and I will see her back in a year.     Thomas C. Daleen Squibb, MD, [facility]  Electronically Signed    TCW/MedQ  DD: 07/18/2007  DT: 07/18/2007  Job #: [id number]   cc:   Delaney Meigs, M.D.

## 2011-02-24 ENCOUNTER — Ambulatory Visit: Payer: Medicare Other | Admitting: Physical Therapy

## 2011-02-25 ENCOUNTER — Encounter: Payer: Medicare Other | Admitting: *Deleted

## 2011-02-26 NOTE — Op Note (Signed)
NAMEFLORABELLE, CARDIN               ACCOUNT NO.:  [id number]   MEDICAL RECORD NO.:  [id number]          PATIENT TYPE:  AMB   LOCATION:  ENDO                         FACILITY:  [facility]   PHYSICIAN:  John C. Madilyn Fireman, M.D.    DATE OF BIRTH:  10/10/1924   DATE OF PROCEDURE:  10/26/2005  DATE OF DISCHARGE:                                 OPERATIVE REPORT   PROCEDURE:  Colonoscopy with polypectomy.   INDICATIONS FOR PROCEDURE:  History of adenomatous colon polyps, __________  surveillance.   DESCRIPTION OF PROCEDURE:  The patient was placed in the left lateral  decubitus position then placed on pulse monitor with continuous low-flow  oxygen delivered by nasal cannula. She was sedated with 37.5 mcg IV fentanyl  and 5 mg IV Versed. The Olympus video colonoscope was inserted into the  rectum and advanced to the cecum, confirmed by transillumination at  McBurney's point and visualization at the ileocecal valve and appendiceal  orifice. The prep was good. The cecum and ascending colon all appeared  normal with no masses, polyps, diverticula or other mucosal abnormalities.  Within the transverse colon was a 7-8 mm sessile polyp that was removed by a  snare. The remainder of the transverse, descending and sigmoid all appeared  normal as did the rectum.  The scope was withdrawn and the patient went to  the recovery room in stable condition. She tolerated the procedure well and  there were no immediate complications.   IMPRESSION:  Small transverse colon polyp.   PLAN:  Will await histology and probably repeat colonoscopy in 5 years.           ______________________________  Everardo All Madilyn Fireman, M.D.     JCH/MEDQ  D:  10/26/2005  T:  10/26/2005  Job:  [id number]   cc:   Delaney Meigs, M.D.  Fax: [phone]

## 2011-02-26 NOTE — H&P (Signed)
[name], [name]               ACCOUNT NO.:  [id number]   MEDICAL RECORD NO.:  [id number]          PATIENT TYPE:  INP   LOCATION:  1824                         FACILITY:  [facility]   PHYSICIAN:  Nicolasa Ducking, ANP DATE OF BIRTH:  [birth year]   DATE OF ADMISSION:  06/16/2006  DATE OF DISCHARGE:                                HISTORY & PHYSICAL   PRIMARY CARE PHYSICIAN:  Dr. Joette Catching in [place].   PRIMARY CARDIOLOGIST:  Dr. Valera Castle.   PATIENT PROFILE:  77 year old widowed white female with prior history of CAD  status post MI who presents following episode of nausea, diaphoresis and  shortness of breath and headache with vertiginous symptoms.   PROBLEM:  1. Coronary artery disease.  Status post non-T elevation MI in 2005 with      catheterization revealing a 90% proximal stenosis and a small to      moderate size left circumflex and a 99% stenosis in the right coronary      artery.  The RCA was successfully stented with a 4.0 x 18 mm Vision      bare metal stents.  05/19/2006 adenosine Myoview EF 53%, fixed anterior      defect with question of rest attenuation, no evidence of ischemia.  2. Hypertension.  3. Hyperlipidemia.  4. Polymyalgia rheumatica.  5. Glaucoma.  6. Hiatal hernia.  7. Peptic ulcer disease.  8. Appendectomy.  9. Hysterectomy.  10.Eye surgery.  11.Osteoarthritis  12.History of colon polyps.   HISTORY OF PRESENT ILLNESS:  77 year old white female with history of CAD  status post MI in 2005 with PCI and bare metal stent placement to the RCA  with residual 90% ostial left circumflex disease.  The circumflex was felt  to be a small to moderately-sized vessel.  For the past year she has had  some degree of dyspnea on exertion but generally does not limit her  activities.  She notes at baseline that she is very active around her house  in the yard without significant limitations despite the dyspnea.  She  actually had a negative adenosine Myoview  May 19, 2006.  This a.m. she  woke at 6:00 a.m. and felt woozy when she was walking to the bathroom.  She urinated and then returned to bed.  When she laid back down in bed, she  felt as though the room was spinning and she therefore adjusted on her head  on the pillow with resolution of vertiginous symptoms after about 30  seconds.  She then, however, felt restless as though she could not get  comfortable and mildly short of breath, prompting her to sit up.  Once she  sat up she felt nauseated with dry heaves and was markedly diaphoretic.  She  called her daughter into the room for support.  Her daughter checked her  blood pressure and noted that was in the 170s systolically.  The worst of  her symptoms lasted for about 30 minutes including dry heaving, nausea,  diaphoresis and mild shortness of breath and then after that period she  continued to feel weak and mildly  nauseated.  Symptoms persisted for a total  of approximately 2 hours from the time she awoke and her daughter drove her  to Dr. Joyce Copa office and they were seen just at opening at around 8:00  a.m.Marland Kitchen  The EKG was performed there showing heart rate in the 50s without any  acute ST-T changes and her blood pressure was reportedly 90/60.  Decision  was made to have her come in to [facility] ED for cardiology evaluation.  Currently she is symptom free.  Blood pressure is in the 170s and  the heart  rate in the 50s and 60s and sinus arrhythmia.  We have noted a new right  axis that was not present on her EKG this morning.  She denies any chest  pain or her anginal equivalent which was right shoulder and arm pain.  No  PND, orthopnea, true syncope or early satiety.   ALLERGIES:  PENICILLIN, DEMEROL ACE INHIBITORS WHICH CAUSES ANGIOEDEMA AND  THERE IS QUESTION AS TO WHETHER OR NOT PLAVIX CAUSES RASH.   HOME MEDICATIONS:  Toprol XL 12.5 mg q.d., Caltrate 6 mg q.d., prednisone 5  mg q.d., Fosamax 70 mg q. week, aspirin 81 grams  q.d., Vytorin 10/40 mg  q.d., vitamin C q.d., Norvasc 2.5 mg q.h.s., Keflex 250 mg q.i.d. which she  is taking for a left lateral ankle ulceration.   FAMILY HISTORY:  Mother died of a ruptured appendix at age 57.  Father died  of cancer at age 10.  She had three brothers, one died at age 27 months, one  died at 42 of lung cancer and one was alive and well.  She has two sisters,  one suffered a stroke in the other has COPD.   SOCIAL HISTORY:  She lives in [place] with her daughter.  She is a retired  Tree surgeon.  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  She has never  smoked cigarettes, does not use alcohol or drugs.  She does not routinely  exercise but is active around her house and yard.  She tries to adhere to a  low fat diet.   REVIEW OF SYSTEMS:  Positive for headache, vertigo, diaphoresis and dyspnea  on exertion, shortness of breath, a left lateral lower leg ulceration for  which she is taking Keflex, nausea and dry heaving, pain and bilateral lower  legs which she attributes to polymyalgia rheumatica.  All other systems  reviewed are negative.   PHYSICAL EXAM:  Temperature 96.9, heart rate 55.  Respirations 12, blood  pressure 170/93 pulse ox 99% 2 liters.  Pleasant white female in no acute distress.  Awake, alert, oriented x3.  NECK:  No bruits or JVD.  LUNGS:  Respirations regular unlabored clear to auscultation.  CARDIAC:  Regular S1, S2, no S3, S4 murmurs.  ABDOMEN:  Round, soft, nontender, nondistended.  Bowel sounds present.  EXTREMITIES:  Warm, dry, pink with 1+ bilateral lower extremity edema.  She  is tender to palpation of her lower extremities.  She does have a well-  healing left ankle ulceration.  Chest x-ray is pending.  EKG shows sinus  rhythm with a rate of 62, poor R-wave progression and new right axis.  She  does have a intraventricular conduction delay.   LAB WORK:  Hemoglobin 14.6, hematocrit 42.6, WBC 6.8, platelets 204.  Sodium 139, potassium  4.1, chloride 103,  CO2 29, BUN 7, creatinine 0.8, glucose  112, total bilirubin 0.9, alkaline phosphatase 45, AST 27, ALT  21, total  protein 6.6, CK 97, MB 2.6, troponin I 0.01, PTT 29.  PT 14.0, INR 1.1.   ASSESSMENT/PLAN:  1. Nausea, diaphoresis occurred after going to the bathroom and      vertiginous symptoms.  Question vertigo versus post micturition      presyncope/vagal episode versus question of PE.  She did have mild      shortness of breath associated with these symptoms although she did not      have any chest pain.  Plan to admit and cycle cardiac markers.  Will      check a D-dimer and followed telemetry.  Will also check 2-D      echocardiogram.  P.r.n. Zofran.  2. Hypertension.  This appears to labile.  Titrate Norvasc, continue low      dose beta blocker.  3. Hyperlipidemia.  Check lipids and LFTs.  Continue statin and Zetia.  4. Polymyalgia rheumatica, continue prednisone.  5. Left ankle ulceration, continue Keflex.           ______________________________  Nicolasa Ducking, ANP     CB/MEDQ  D:  06/16/2006  T:  06/16/2006  Job:  [id number]

## 2011-02-26 NOTE — Consult Note (Signed)
[place]. [facility]  Patient:    [name], [name] Visit Number: [id number] MRN: [id number]          Service Type: MED Location: [phone] Attending Physician:  Nelda Marseille Dictated by:   Petra Kuba, M.D. Proc. Date: 07/21/01 Admit Date:  07/21/2001 Discharge Date: 07/22/2001   CC:         John C. Madilyn Fireman, M.D.  Delaney Meigs, M.D.   Consultation Report  HISTORY OF PRESENT ILLNESS:  This patient is well known to my partner, Dr. Madilyn Fireman, with a history of ulcer bleeding 10 years ago, who has polymyalgia rheumatica and has been on chronic nonsteroidals, but has also been on some Protonix to protect her stomach.  She had been on Lodine then was switched to prednisone, and recently was on some Bextra.  She has not taken any extra aspirin or nonsteroidals.  Yesterday she had some stomach pains, passed a black stool.  She did pass another one this morning and saw Dr. Lysbeth Galas, who found her to be guaiac positive and asked [place] to see her in the emergency room. She has not been weak or dizzy, she tells me.  She was hurting yesterday and took some Pepto-Bismol and then had black bowel movement beforehand, and is feeling better today.  She has not had any heartburn, indigestion, or swelling problems, and no lower bowel complaints.  She has not had any other GI workup or plans recently.  She has not had a colon test.  Interesting enough, she has lost roughly 30 pounds in the last year and is not sure if it is related to the prednisone, but it may be related to stopping the prednisone.  PAST MEDICAL HISTORY:  Pertinent for the polymyalgia rheumatica, as well as an appendectomy, hysterectomy, hemorrhoidectomy, and cataracts.  She also has a history of reflux as well.  FAMILY HISTORY:  Pertinent with dad with lymph node cancer, but no colon cancer, polyps, or ulcers.  SOCIAL HISTORY:  She does not drink or smoke or take any extra aspirin  or nonsteroidals.  REVIEW OF SYSTEMS:  Negative except as above.  PHYSICAL EXAMINATION:  VITAL SIGNS:  See chart.  GENERAL:  The patient is in no acute distress.  HEENT:  Sclerae nonicteric.  NECK:  Supple without adenopathy.  LUNGS:  Clear.  HEART:  Regular rate and rhythm.  ABDOMEN:  Soft, nontender.  Good bowel sounds.  RECTAL:  Not repeated.  Guaiac positive black stools per Dr. Lysbeth Galas, according to my nurse.  EXTREMITIES:  Adequate peripheral pulses, no pedal edema.  LABORATORY:  Labs today pending at the time of this dictation.  Labs from Dr. Deitra Mayo office from a week ago accompanying the patient and for a normal BUN and creatinine.  H&H this morning at Dr. Deitra Mayo, however, pertinent for being 11.9.  Hemoglobin with white count and platelet count was 12.7 on October 1, normal MCV.  ASSESSMENT: 1. Guaiac positive black stools in patient with a history of ulcers and    hiatal hernia who is also on chronic nonsteroidals, even though she is on    pump inhibitor. 2. Polymyalgia rheumatica. 3. High blood pressure. 4. History of hemorrhoidectomy, cataracts, appendectomy, and hysterectomy.  PLAN:  The risks and benefits and methods of endoscopy were discussed.  We will go ahead and proceed with further workup plans, possibly admission pending those findings.  In the meantime we are waiting repeat labs, including BUN, chemistries, repeat CBC, and coags.  Dictated by:   Petra Kuba, M.D. Attending Physician:  Nelda Marseille DD:  07/21/01 TD:  07/21/01 Job: [id number]

## 2011-02-26 NOTE — Discharge Summary (Signed)
[name], [name]               ACCOUNT NO.:  [id number]   MEDICAL RECORD NO.:  [id number]          PATIENT TYPE:  INP   LOCATION:  4705                         FACILITY:  [facility]   PHYSICIAN:  Dorian Pod, ACNP  DATE OF BIRTH:  17-Oct-1923   DATE OF ADMISSION:  06/16/2006  DATE OF DISCHARGE:  06/17/2006                                 DISCHARGE SUMMARY   DISCHARGING PHYSICIAN:  Gerrit Friends. Dietrich Pates, M.D.   PRIMARY CARDIOLOGIST:  Jesse Sans. Wall, M.D.   PRIMARY CARE PHYSICIAN:  Delaney Meigs, M.D.   DISCHARGING DIAGNOSES:  1. Recent vertigo.  2. Labile blood pressure.  3. Moderate LV impairment status post echocardiogram, ejection fraction 35-      to-45%.  4. Mild diffuse left ventricular hypokinesis.  5. Severe hypokinesis of posterior lateral wall.  6. Mild-to-moderate aortic valve regurgitation.  7. Moderate mitral valve regurgitation.   PAST MEDICAL HISTORY:  Includes:  1. Coronary artery disease, non-Q wave MI in 2005, PTI of the right      coronary artery.  2. Hyperlipidemia.  3. Hypertension.  4. Polymyalgia rheumatica requiring chronic prednisone.  5. Glaucoma.  6. Hiatal hernia.  7. Peptic ulcer disease.  8. Arthritis.  9. Colon polyps.  10.MULTIPLE ALLERGIES INCLUDING PENICILLIN, DEMEROL, LISINOPRIL/ACE      INHIBITORS, QUESTIONABLE RASH TO PLAVIX.   HOSPITAL COURSE:  Ms. Gemmill in an 77 year old patient of Dr. Daleen Squibb and Dr.  Lysbeth Galas with history as stated above who woke on day of admission complaining  of feeling woozy when walking, she felt like the room was spinning, she  became diaphoretic and dizzy, had a headache for a few hours, apparently was  evaluated and found to have a blood pressure of 90/60 with a heart rate in  the 50s.  Patient was seen by Ward Givens and Nicholes Mango.  Patient was  admitted for observation, questionable vagal or cardiac episode or just  simply vertigo.  EKG shows a new R axis with poor R wave progression,  otherwise  sinus rhythm.  Lab work stable, negative cardiac enzymes.  Patient  admitted to telemetry, 2-D echocardiogram results as stated above.  BNP 400,  D-dimer negative, cardiac markers negative.  Dr. Dietrich Pates was in to see  patient on day of discharge.  Patient states she feels back to normal, blood  pressure stable at 130/65, heart rate of 52, respirations 18, temperature  98.5.  Patient being discharged home to follow up with Dr. Daleen Squibb or Dr.  Carmin Richmond., consider outpatient discharging Norvasc and increasing the  patient's beta-blocker, maybe try a low-dose ARB.   MEDICATIONS AT DISCHARGE:  Include:  1. Toprol XL 12.5 mg daily.  2. Prednisone as previously prescribed.  3. Keflex, Fosamax and vitamins as previously prescribed.  4. Aspirin 81daily.  5. Vytorin 10/40 mg daily.  6. Norvasc 2.5 mg daily.   Patient has a followup appointment with Dr. Carmin Richmond. September 20 at 9:15  a.m.  Patient instructed to continue her previous diet, activity as  tolerated, wound care not applicable.   Duration of discharge encounter 25 minutes.  ______________________________  Dorian Pod, ACNP     MB/MEDQ  D:  06/17/2006  T:  06/17/2006  Job:  [id number]   cc:   Delaney Meigs, M.D.

## 2011-02-26 NOTE — Cardiovascular Report (Signed)
NAME:  [name], [name]                         ACCOUNT NO.:  [id number]   MEDICAL RECORD NO.:  [id number]                   PATIENT TYPE:  INP   LOCATION:  2904                                 FACILITY:  [facility]   PHYSICIAN:  Carole Binning, M.D. [facility]         DATE OF BIRTH:  Mar 03, 1924   DATE OF PROCEDURE:  03/27/2004  DATE OF DISCHARGE:                              CARDIAC CATHETERIZATION   PROCEDURE PERFORMED:  Percutaneous transluminal coronary angioplasty with  placement of bare metal stent in the proximal right coronary artery.   INDICATION:  [name] is a 77 year old woman who was admitted with non-Q-  wave myocardial infarction.  Cardiac catheterization was performed yesterday  by Dr. Andee Lineman and revealed a 99% stenosis in the proximal right coronary  artery.  She returned to the laboratory today for intervention of the right  coronary artery.   CATHETERIZATION PROCEDURAL NOTE:  A 6 French sheath was placed in the right  femoral artery.  The patient had been enrolled in the Acuity trial and was  treated with Angiomax per protocol.  We used a 6 [place] JR-4 guiding  catheter.  Angiographic images of the proximal right coronary artery  revealed a tubular 99% stenosis in the proximal vessel and was a very large  caliber right coronary artery.  We advanced an Asahi soft coronary guide  wire under fluoroscopic guidance into the distal right coronary artery.  We  then performed percutaneous transluminal coronary angioplasty with a 3.5 x  15-mm Quantum balloon inflated to 16 atmospheres.  Following this, we  positioned a 4.0 x 18-mm Vision stent across the stenosis and deployed this  stent at 16 atmospheres.  We went back in with a 4.5 x 15-mm Quantum balloon  and inflated this to 16 atmospheres in the distal aspect of the stent and 20  atmospheres in the proximal aspect of the stent.  Following this, we went in  with a 5.0 x 12-mm Quantum balloon and inflated this to 12 atmospheres  in  the mid portion of the stent and 16 atmospheres in the proximal portion of  the stent.  Intermittent doses of nitroglycerin were administered.  Final  angiographic images were obtained revealing patency to the right coronary  artery with 0% residual stenosis at the stent site and TIMI-3 flow.   COMPLICATIONS:  None.   RESULTS:  Successful percutaneous transluminal coronary angioplasty with  placement of a bare metal stent in the proximal right coronary artery. A 99%  stenosis was reduced to 0% residual with TIMI-3 flow.   PLAN:  At the conclusion of the procedure, an Angio-Seal vascular closure  device was placed in the right femoral artery.  The patient will be treated  with Plavix for minimum of one month.  We would recommend consideration of  use of Plavix for 9 to 12 months because of her presentation with an acute  coronary syndrome.  Carole Binning, M.D. [facility]   MWP/MEDQ  D:  03/27/2004  T:  03/27/2004  Job:  [id number]   cc:   Delaney Meigs, M.D.  [address]  [place]  [place] [address]  Fax: [phone]

## 2011-03-01 ENCOUNTER — Encounter: Payer: Medicare Other | Admitting: Physical Therapy

## 2011-03-04 ENCOUNTER — Encounter: Payer: Medicare Other | Admitting: Physical Therapy

## 2011-04-21 ENCOUNTER — Other Ambulatory Visit: Payer: Self-pay | Admitting: Cardiology

## 2011-05-05 ENCOUNTER — Other Ambulatory Visit: Payer: Self-pay | Admitting: Cardiology

## 2011-05-06 NOTE — Telephone Encounter (Signed)
[place] pt. 

## 2011-07-15 ENCOUNTER — Other Ambulatory Visit: Payer: Self-pay | Admitting: Cardiology

## 2011-07-15 NOTE — Telephone Encounter (Signed)
Pt needs follow-up appt with Dr. Daleen Squibb

## 2011-07-28 LAB — URINALYSIS, ROUTINE W REFLEX MICROSCOPIC
Glucose, UA: NEGATIVE
Ketones, ur: NEGATIVE
Nitrite: NEGATIVE
Protein, ur: NEGATIVE
Urobilinogen, UA: 0.2

## 2011-07-28 LAB — COMPREHENSIVE METABOLIC PANEL
ALT: 15
BUN: 13
CO2: 27
Calcium: 9.4
GFR calc non Af Amer: >60
Glucose, Bld: 108 — ABNORMAL HIGH
Sodium: 135
Total Protein: 6.4

## 2011-07-28 LAB — CBC
HCT: 42.7
Hemoglobin: 14.5
MCHC: 34
MCV: 95
RBC: 4.49
RDW: 14.5 — ABNORMAL HIGH

## 2011-07-28 LAB — URINE CULTURE

## 2011-07-28 LAB — POCT CARDIAC MARKERS
CKMB, poc: 1.6
Myoglobin, poc: 228
Operator id: 270111

## 2011-07-28 LAB — DIFFERENTIAL
Basophils Relative: 0
Eosinophils Absolute: 0
Lymphs Abs: 1.1
Monocytes Relative: 6
Neutro Abs: 5.2
Neutrophils Relative %: 77

## 2011-08-18 ENCOUNTER — Other Ambulatory Visit: Payer: Self-pay | Admitting: *Deleted

## 2011-08-18 MED ORDER — HYDRALAZINE HCL 10 MG PO TABS: 10.0000 mg | ORAL_TABLET | Freq: Two times a day (BID) | ORAL | Status: DC

## 2011-09-16 ENCOUNTER — Encounter (HOSPITAL_COMMUNITY): Payer: Self-pay | Admitting: *Deleted

## 2011-09-16 ENCOUNTER — Emergency Department (HOSPITAL_COMMUNITY): Payer: Medicare Other

## 2011-09-16 ENCOUNTER — Emergency Department (HOSPITAL_COMMUNITY)
Admission: EM | Admit: 2011-09-16 | Discharge: 2011-09-17 | Disposition: A | Discharge status: Home / Self Care | Payer: Medicare Other | Attending: Emergency Medicine | Admitting: Emergency Medicine

## 2011-09-16 ENCOUNTER — Other Ambulatory Visit: Payer: Self-pay

## 2011-09-16 DIAGNOSIS — R10819 Abdominal tenderness, unspecified site: Secondary | ICD-10-CM | POA: Insufficient documentation

## 2011-09-16 DIAGNOSIS — R109 Unspecified abdominal pain: Secondary | ICD-10-CM | POA: Insufficient documentation

## 2011-09-16 DIAGNOSIS — E785 Hyperlipidemia, unspecified: Secondary | ICD-10-CM | POA: Insufficient documentation

## 2011-09-16 DIAGNOSIS — I252 Old myocardial infarction: Secondary | ICD-10-CM | POA: Insufficient documentation

## 2011-09-16 DIAGNOSIS — I2510 Atherosclerotic heart disease of native coronary artery without angina pectoris: Secondary | ICD-10-CM | POA: Insufficient documentation

## 2011-09-16 DIAGNOSIS — Z79899 Other long term (current) drug therapy: Secondary | ICD-10-CM | POA: Insufficient documentation

## 2011-09-16 LAB — CBC
HCT: 40.3 % (ref 36.0–46.0)
Hemoglobin: 13.2 g/dL (ref 12.0–15.0)
MCH: 32.1 pg (ref 26.0–34.0)
MCHC: 32.8 g/dL (ref 30.0–36.0)
MCV: 98.1 fL (ref 78.0–100.0)
Platelets: 204 10*3/uL (ref 150–400)
RBC: 4.11 MIL/uL (ref 3.87–5.11)
RDW: 13.7 % (ref 11.5–15.5)
WBC: 7.3 10*3/uL (ref 4.0–10.5)

## 2011-09-16 LAB — URINE MICROSCOPIC-ADD ON

## 2011-09-16 LAB — URINALYSIS, ROUTINE W REFLEX MICROSCOPIC
Bilirubin Urine: NEGATIVE
Glucose, UA: NEGATIVE mg/dL
Hgb urine dipstick: NEGATIVE
Ketones, ur: NEGATIVE mg/dL
Nitrite: NEGATIVE
Protein, ur: NEGATIVE mg/dL
Specific Gravity, Urine: 1.016 (ref 1.005–1.030)
Urobilinogen, UA: 0.2 mg/dL (ref 0.0–1.0)
pH: 5.5 (ref 5.0–8.0)

## 2011-09-16 NOTE — ED Notes (Signed)
Pt in c/o left flank pain, per family pt has had intermittent pain x3 years in this area and has been evaluated for same multiple times without a diagnosis, tonight increased pain

## 2011-09-16 NOTE — ED Provider Notes (Signed)
History    87yf presenting with abdominal pain. Pain is in LUQ to L flank. "My stomach rumbles and I feel like it's going to pop." On my exam pt with complaints. Says pain has resolved. Pt with multiple episodes of similar pain in past 3 years. Multiple evaluations including by gastroenterology without clear etiology. Pain not really different from previous. No radiation. No fever or chills. No urinary complaints. No n/v/d. Denies trauma. Abdominal surgical hx significant for appy and hysterectomy many years ago. No recent procedures. No vaginal bleeding. No CP or SOB. No diaphoresis.  CSN: [id number] Arrival date & time: 09/16/2011  6:15 PM   First MD Initiated Contact with Patient 09/16/11 2146      Chief Complaint  Patient presents with  . Flank Pain    (Consider location/radiation/quality/duration/timing/severity/associated sxs/prior treatment) HPI  Past Medical History  Diagnosis Date  . CAD (coronary artery disease)     s/p MI in 2005 -- has bare metal stent in the R coronary artery  . Systolic dysfunction   . Dyspnea on exertion   . Orthostatic hypotension   . HLD (hyperlipidemia)   . Moderate mitral regurgitation by prior echocardiogram     Past Surgical History  Procedure Date  . Colonoscopy w/ polypectomy   . Ectropion repair     left lower eyelid    Family History  Problem Relation Age of Onset  . Appendicitis Mother   . Cancer Father   . Lung cancer Brother   . Stroke Sister   . COPD Sister     History  Substance Use Topics  . Smoking status: Never Smoker   . Smokeless tobacco: Not on file  . Alcohol Use: No    OB History    Grav Para Term Preterm Abortions TAB SAB Ect Mult Living                  Review of Systems   Review of symptoms negative unless otherwise noted in HPI.  Allergies  Meperidine hcl and Penicillins  Home Medications   Current Outpatient Rx  Name Route Sig Dispense Refill  . VITAMIN C 1000 MG PO TABS Oral Take 1,000 mg  by mouth daily.      . ASPIRIN 81 MG PO TABS Oral Take 81 mg by mouth every other day.     Marland Kitchen CALCIUM CARBONATE-VITAMIN D 600-400 MG-UNIT PO TABS Oral Take 1 tablet by mouth 2 (two) times daily.      Marland Kitchen COREG 3.125 MG PO TABS  TAKE  (1)  TABLET TWICE A DAY. 60 each 6  . DOCUSATE SODIUM 100 MG PO CAPS Oral Take 100 mg by mouth 2 (two) times daily.      Marland Kitchen FLUTICASONE-SALMETEROL 250-50 MCG/DOSE IN AEPB Inhalation Inhale 1 puff into the lungs every 12 (twelve) hours.      . FUROSEMIDE 20 MG PO TABS Oral Take 20 mg by mouth daily. Take extra dose when weight gain by 3 lbs     . HYDRALAZINE HCL 10 MG PO TABS Oral Take 1 tablet (10 mg total) by mouth 2 (two) times daily. 60 tablet 3  . HYDROCODONE-ACETAMINOPHEN 5-325 MG PO TABS Oral Take 1 tablet by mouth every 6 (six) hours as needed.      Marland Kitchen HYOSCYAMINE SULFATE 0.125 MG PO TABS Oral Take 0.125 mg by mouth every 4 (four) hours as needed.      Marland Kitchen K-DUR 20 MEQ PO TBCR  TAKE 1 TABLET DAILY 30 each 5  Pt needs follow-up appt with Dr. Daleen Squibb  . MECLIZINE HCL 12.5 MG PO TABS Oral Take 12.5 mg by mouth 3 (three) times daily as needed.      . METHOCARBAMOL 500 MG PO TABS Oral Take 500 mg by mouth as needed.      Marland Kitchen PREDNISONE 5 MG PO TABS Oral Take 5 mg by mouth daily.      Marland Kitchen PROMETHAZINE HCL 25 MG PO TABS Oral Take 25 mg by mouth every 6 (six) hours as needed.      Marland Kitchen NITROGLYCERIN 0.4 MG SL SUBL Sublingual Place 0.4 mg under the tongue every 5 (five) minutes as needed.        BP 154/74  Pulse 84  Temp(Src) 98.3 F (36.8 C) (Oral)  Resp 20  SpO2 100%  Physical Exam  Nursing note and vitals reviewed. Constitutional: She appears well-developed and well-nourished. No distress.  HENT:  Head: Normocephalic and atraumatic.  Eyes: Conjunctivae are normal. Right eye exhibits no discharge. Left eye exhibits no discharge.  Neck: Neck supple.  Cardiovascular: Normal rate, regular rhythm and normal heart sounds.  Exam reveals no gallop and no friction rub.   No  murmur heard. Pulmonary/Chest: Effort normal and breath sounds normal. No respiratory distress.  Abdominal: Soft. She exhibits no distension. There is tenderness.       Mild epigastric and LUQ tenderness without guarding or rebound.  Genitourinary:       No cva tenderness.  Musculoskeletal: She exhibits no edema and no tenderness.  Neurological: She is alert.  Skin: Skin is warm and dry.  Psychiatric: She has a normal mood and affect. Her behavior is normal. Thought content normal.     Review of symptoms negative unless otherwise noted in HPI.   ED Course  Procedures (including critical care time)  Labs Reviewed  COMPREHENSIVE METABOLIC PANEL - Abnormal; Notable for the following:    GFR calc non Af Amer 45 (*)    GFR calc Af Amer 53 (*)    All other components within normal limits  URINALYSIS, ROUTINE W REFLEX MICROSCOPIC - Abnormal; Notable for the following:    APPearance CLOUDY (*)    Leukocytes, UA MODERATE (*)    All other components within normal limits  URINE MICROSCOPIC-ADD ON - Abnormal; Notable for the following:    Squamous Epithelial / LPF MANY (*)    Bacteria, UA FEW (*)    Casts HYALINE CASTS (*)    All other components within normal limits  CBC  LIPASE, BLOOD  LACTIC ACID, PLASMA   Ct Abdomen Pelvis W Contrast  09/17/2011  *RADIOLOGY REPORT*  Clinical Data: Left upper quadrant and flank pain.  CT ABDOMEN AND PELVIS WITH CONTRAST  Technique:  Multidetector CT imaging of the abdomen and pelvis was performed following the standard protocol during bolus administration of intravenous contrast.  Contrast: 80mL OMNIPAQUE IOHEXOL 300 MG/ML IV SOLN  Comparison: Abdominal pelvic CT 10/07/2009.  Findings: The lung bases are clear.  There is no pleural effusion. Coronary artery calcifications and mild cardiomegaly are noted.  Several low density hepatic lesions are stable and consistent with cysts.  The gallbladder, biliary system, pancreas, spleen, adrenal glands and kidneys  appear normal.  The bowel gas pattern is normal. The stomach is poorly distended and suboptimally evaluated.  There is stool throughout the colon. No inflammatory changes are identified.  There is diffuse tortuosity of the abdominal aorta which is heavily calcified.  There is mild lumbar spondylosis associated with a convex  right scoliosis.  Multiple thoracolumbar compression deformities are again noted, not grossly changed.  Osseous retropulsion at L1 and appears unchanged.  IMPRESSION:  1.  No significant changes are identified compared with the prior study.  No definite acute findings are seen. 2.  The stomach is poorly distended and suboptimally evaluated in this patient with left upper quadrant pain. 3.  Stable aortic atherosclerosis and tortuosity. 4.  Stable multilevel thoracolumbar compression deformities.  Original Report Authenticated By: Gerrianne Scale, M.D.    EKG:  Rhythm: sinus brady Rate: 54 Axis: indeterminant Intervals: LBBB ST segments: NSST changes   1. Abdominal pain       MDM  87yf with abdominal pain. Hx of similar symptoms in past. Discussed with pt and daughter concerning w/u vs not given this and also that pain free on my evaluation. Pt noncommital so w/u initiated and feel appropriate given at increased risk ofmorbidity because of age and does have some tenderness. W/u relatively unremarkable and not suggestive of etiology. Pt has remained pain free from my initial encounter to discharge. Repeat abdominal exam prior to DC improved. Strict return precautions given. Outpt fu.        Raeford Razor, MD 09/17/11 [phone]

## 2011-09-17 LAB — COMPREHENSIVE METABOLIC PANEL
ALT: 8 U/L (ref 0–35)
AST: 14 U/L (ref 0–37)
Albumin: 3.9 g/dL (ref 3.5–5.2)
Alkaline Phosphatase: 62 U/L (ref 39–117)
BUN: 23 mg/dL (ref 6–23)
CO2: 30 mEq/L (ref 19–32)
Calcium: 9.8 mg/dL (ref 8.4–10.5)
Chloride: 103 mEq/L (ref 96–112)
Creatinine, Ser: 1.08 mg/dL (ref 0.50–1.10)
GFR calc Af Amer: 53 mL/min — ABNORMAL LOW (ref >90)
GFR calc non Af Amer: 45 mL/min — ABNORMAL LOW (ref >90)
Glucose, Bld: 96 mg/dL (ref 70–99)
Potassium: 4.1 mEq/L (ref 3.5–5.1)
Sodium: 140 mEq/L (ref 135–145)
Total Bilirubin: 0.4 mg/dL (ref 0.3–1.2)
Total Protein: 6.6 g/dL (ref 6.0–8.3)

## 2011-09-17 LAB — LACTIC ACID, PLASMA: Lactic Acid, Venous: 1.4 mmol/L (ref 0.5–2.2)

## 2011-09-17 LAB — LIPASE, BLOOD: Lipase: 19 U/L (ref 11–59)

## 2011-09-17 MED ORDER — IOHEXOL 300 MG/ML  SOLN: 80.0000 mL | Freq: Once | INTRAMUSCULAR | Status: DC | PRN

## 2011-09-17 MED ORDER — IOHEXOL 300 MG/ML  SOLN
80.0000 mL | Freq: Once | INTRAMUSCULAR | Status: AC | PRN
  Administered 2011-09-17: 80 mL via INTRAVENOUS

## 2011-09-17 NOTE — ED Notes (Signed)
Patient transported to X-ray 

## 2011-12-03 ENCOUNTER — Other Ambulatory Visit: Payer: Self-pay | Admitting: *Deleted

## 2011-12-03 MED ORDER — FUROSEMIDE 20 MG PO TABS: 20.0000 mg | ORAL_TABLET | Freq: Every day | ORAL | Status: DC

## 2011-12-18 ENCOUNTER — Other Ambulatory Visit: Payer: Self-pay | Admitting: Cardiology

## 2011-12-31 ENCOUNTER — Other Ambulatory Visit: Payer: Self-pay | Admitting: *Deleted

## 2011-12-31 MED ORDER — HYDRALAZINE HCL 10 MG PO TABS: 10.0000 mg | ORAL_TABLET | Freq: Two times a day (BID) | ORAL | Status: DC

## 2012-01-17 ENCOUNTER — Other Ambulatory Visit: Payer: Self-pay | Admitting: Cardiology

## 2012-01-31 ENCOUNTER — Encounter: Payer: Self-pay | Admitting: Cardiology

## 2012-01-31 ENCOUNTER — Ambulatory Visit (INDEPENDENT_AMBULATORY_CARE_PROVIDER_SITE_OTHER): Payer: Medicare Other | Admitting: Cardiology

## 2012-01-31 VITALS — BP 110/70 | HR 58 | Ht 63.0 in | Wt 121.0 lb

## 2012-01-31 DIAGNOSIS — I2510 Atherosclerotic heart disease of native coronary artery without angina pectoris: Secondary | ICD-10-CM

## 2012-01-31 DIAGNOSIS — I080 Rheumatic disorders of both mitral and aortic valves: Secondary | ICD-10-CM

## 2012-01-31 DIAGNOSIS — I252 Old myocardial infarction: Secondary | ICD-10-CM

## 2012-01-31 DIAGNOSIS — I5022 Chronic systolic (congestive) heart failure: Secondary | ICD-10-CM

## 2012-01-31 DIAGNOSIS — I951 Orthostatic hypotension: Secondary | ICD-10-CM

## 2012-01-31 MED ORDER — FUROSEMIDE 20 MG PO TABS: 20.0000 mg | ORAL_TABLET | Freq: Every day | ORAL | Status: DC

## 2012-01-31 MED ORDER — HYDRALAZINE HCL 10 MG PO TABS: 10.0000 mg | ORAL_TABLET | Freq: Two times a day (BID) | ORAL | Status: DC

## 2012-01-31 MED ORDER — CARVEDILOL 3.125 MG PO TABS: 3.1250 mg | ORAL_TABLET | Freq: Two times a day (BID) | ORAL | Status: DC

## 2012-01-31 MED ORDER — POTASSIUM CHLORIDE CRYS ER 20 MEQ PO TBCR: 20.0000 meq | EXTENDED_RELEASE_TABLET | Freq: Every day | ORAL | Status: DC

## 2012-01-31 NOTE — Patient Instructions (Signed)
Your physician recommends that you continue on your current medications as directed. Please refer to the Current Medication list given to you today.   Your physician wants you to follow-up in: 1 year with Dr. Wall. You will receive a reminder letter in the mail two months in advance. If you don't receive a letter, please call our office to schedule the follow-up appointment.  

## 2012-01-31 NOTE — Assessment & Plan Note (Signed)
Fall risk is high. Safety reviewed and ways to avoid falls with patient and daughter.

## 2012-01-31 NOTE — Assessment & Plan Note (Signed)
Stable. No change in treatment. 

## 2012-01-31 NOTE — Progress Notes (Signed)
HPI [name] returns today for evaluation and management of her coronary artery disease, history of MI, bare-metal stent to the right coronary artery in 2005, chronic systolic dysfunction with no heart failure, orthostatic hypotension, and moderate mitral regurgitation.  She lives with her daughter. She is no chest pain or shortness of breath. She is a high fall risk but has not fallen. She denies any bleeding.  She's made very clear that if she had to require a breathing machine that she would not want it. Her husband has passed away and she knows she will join him. She underwent a recent colonoscopy and made it clear to Dr. Madilyn Fireman that she would not have any treatment if she had cancer.  Past Medical History  Diagnosis Date  . CAD (coronary artery disease)     s/p MI in 2005 -- has bare metal stent in the R coronary artery  . Systolic dysfunction   . Dyspnea on exertion   . Orthostatic hypotension   . HLD (hyperlipidemia)   . Moderate mitral regurgitation by prior echocardiogram     Current Outpatient Prescriptions  Medication Sig Dispense Refill  . Ascorbic Acid (VITAMIN C) 1000 MG tablet Take 1,000 mg by mouth daily.        Marland Kitchen aspirin 81 MG tablet Take 81 mg by mouth every other day.       . Calcium Carbonate-Vitamin D (CALTRATE 600+D) 600-400 MG-UNIT per tablet Take 1 tablet by mouth 2 (two) times daily.        Marland Kitchen COREG 3.125 MG tablet TAKE  (1)  TABLET TWICE A DAY.  60 each  5  . dextromethorphan-guaiFENesin (MUCINEX DM) 30-600 MG per 12 hr tablet Take 1 tablet by mouth every 12 (twelve) hours.      . docusate sodium (COLACE) 100 MG capsule Take 100 mg by mouth 2 (two) times daily.        . Fluticasone-Salmeterol (ADVAIR DISKUS) 250-50 MCG/DOSE AEPB Inhale 1 puff into the lungs every 12 (twelve) hours.        . furosemide (LASIX) 20 MG tablet Take 1 tablet (20 mg total) by mouth daily. Take extra dose when weight gain by 3 lbs  45 tablet  3  . hydrALAZINE (APRESOLINE) 10 MG tablet  Take 1 tablet (10 mg total) by mouth 2 (two) times daily.  60 tablet  3  . HYDROcodone-acetaminophen (VICODIN) 5-500 MG per tablet Take 1 tablet by mouth every 8 (eight) hours as needed.      . hyoscyamine (LEVSIN, ANASPAZ) 0.125 MG tablet Take 0.125 mg by mouth every 4 (four) hours as needed.        Marland Kitchen K-DUR 20 MEQ tablet TAKE 1 TABLET DAILY  15 each  0  . levofloxacin (LEVAQUIN) 500 MG tablet as directed. For 10 days,      . meclizine (ANTIVERT) 12.5 MG tablet Take 12.5 mg by mouth 3 (three) times daily as needed.        . methocarbamol (ROBAXIN) 500 MG tablet Take 500 mg by mouth as needed.        . nitroGLYCERIN (NITROSTAT) 0.4 MG SL tablet Place 0.4 mg under the tongue every 5 (five) minutes as needed.        . predniSONE (DELTASONE) 5 MG tablet Take 5 mg by mouth daily.        . promethazine (PHENERGAN) 25 MG tablet Take 25 mg by mouth every 6 (six) hours as needed.  Allergies  Allergen Reactions  . Meperidine Hcl Swelling    Swelling of the neck  . Penicillins Rash    Family History  Problem Relation Age of Onset  . Appendicitis Mother   . Cancer Father   . Lung cancer Brother   . Stroke Sister   . COPD Sister     History   Social History  . Marital Status: Widowed    Spouse Name: N/A    Number of Children: 2  . Years of Education: N/A   Occupational History  . retired    Social History Main Topics  . Smoking status: Never Smoker   . Smokeless tobacco: Not on file  . Alcohol Use: No  . Drug Use: No  . Sexually Active: Not on file   Other Topics Concern  . Not on file   Social History Narrative  . No narrative on file    ROS ALL NEGATIVE EXCEPT THOSE NOTED IN HPI  PE  General Appearance: well developed, well nourished in no acute distress HEENT: symmetrical face, PERRLA, good dentition  Neck: no JVD, thyromegaly, or adenopathy, trachea midline Chest: symmetric without deformity Cardiac: PMI non-displaced, RRR, normal S1, S2, no gallop or  murmur Lung: clear to ausculation and percussion Vascular: all pulses full without bruits  Abdominal: nondistended, nontender, good bowel sounds, no HSM, no bruits Extremities: no cyanosis, clubbing or edema, no sign of DVT, no varicosities  Skin: normal color, no rashes Neuro: alert and oriented x 3, non-focal Pysch: normal affect  EKG  BMET    Component Value Date/Time   NA 140 09/16/2011 2323   K 4.1 09/16/2011 2323   CL 103 09/16/2011 2323   CO2 30 09/16/2011 2323   GLUCOSE 96 09/16/2011 2323   BUN 23 09/16/2011 2323   CREATININE 1.08 09/16/2011 2323   CALCIUM 9.8 09/16/2011 2323   GFRNONAA 45* 09/16/2011 2323   GFRAA 53* 09/16/2011 2323    Lipid Panel  No results found for this basename: chol, trig, hdl, cholhdl, vldl, ldlcalc    CBC    Component Value Date/Time   WBC 7.3 09/16/2011 2323   RBC 4.11 09/16/2011 2323   HGB 13.2 09/16/2011 2323   HCT 40.3 09/16/2011 2323   PLT 204 09/16/2011 2323   MCV 98.1 09/16/2011 2323   MCH 32.1 09/16/2011 2323   MCHC 32.8 09/16/2011 2323   RDW 13.7 09/16/2011 2323   LYMPHSABS 0.4* 10/07/2009 1028   MONOABS 0.6 10/07/2009 1028   EOSABS 0.1 10/07/2009 1028   BASOSABS 0.0 10/07/2009 1028

## 2012-01-31 NOTE — Progress Notes (Signed)
Addended by: Lisabeth Devoid F on: 01/31/2012 10:25 AM   Modules accepted: Orders

## 2012-01-31 NOTE — Assessment & Plan Note (Signed)
Stable. No change in medical therapy. She desires to be a DO NOT RESUSCITATE and not intubated. Paperwork completed.

## 2012-04-19 ENCOUNTER — Other Ambulatory Visit: Payer: Self-pay

## 2012-06-13 ENCOUNTER — Other Ambulatory Visit: Payer: Self-pay | Admitting: Cardiology

## 2013-01-30 ENCOUNTER — Other Ambulatory Visit: Payer: Self-pay

## 2013-01-30 MED ORDER — CARVEDILOL 3.125 MG PO TABS: 3.1250 mg | ORAL_TABLET | Freq: Two times a day (BID) | ORAL | Status: DC

## 2013-02-09 ENCOUNTER — Other Ambulatory Visit: Payer: Self-pay | Admitting: *Deleted

## 2013-02-09 MED ORDER — POTASSIUM CHLORIDE CRYS ER 20 MEQ PO TBCR: 20.0000 meq | EXTENDED_RELEASE_TABLET | Freq: Every day | ORAL | Status: DC

## 2013-02-09 MED ORDER — HYDRALAZINE HCL 10 MG PO TABS: 10.0000 mg | ORAL_TABLET | Freq: Two times a day (BID) | ORAL | Status: DC

## 2013-04-05 ENCOUNTER — Other Ambulatory Visit: Payer: Self-pay | Admitting: Cardiology

## 2013-05-03 ENCOUNTER — Other Ambulatory Visit: Payer: Self-pay | Admitting: Cardiology

## 2013-05-21 ENCOUNTER — Other Ambulatory Visit: Payer: Self-pay | Admitting: Cardiology

## 2013-06-08 ENCOUNTER — Other Ambulatory Visit: Payer: Self-pay | Admitting: Cardiology

## 2013-07-30 ENCOUNTER — Other Ambulatory Visit: Payer: Self-pay | Admitting: Gastroenterology

## 2013-07-30 DIAGNOSIS — R1310 Dysphagia, unspecified: Secondary | ICD-10-CM

## 2013-07-31 ENCOUNTER — Ambulatory Visit
Admission: RE | Admit: 2013-07-31 | Discharge: 2013-07-31 | Disposition: A | Discharge status: Home / Self Care | Payer: Medicare Other | Source: Ambulatory Visit | Attending: Gastroenterology | Admitting: Gastroenterology

## 2013-07-31 DIAGNOSIS — R1310 Dysphagia, unspecified: Secondary | ICD-10-CM

## 2013-08-28 ENCOUNTER — Other Ambulatory Visit: Payer: Self-pay | Admitting: Cardiology

## 2013-09-13 ENCOUNTER — Other Ambulatory Visit: Payer: Self-pay | Admitting: Cardiology

## 2013-10-19 ENCOUNTER — Other Ambulatory Visit: Payer: Self-pay | Admitting: Cardiology

## 2013-11-05 ENCOUNTER — Other Ambulatory Visit: Payer: Self-pay | Admitting: Adult Health Nurse Practitioner

## 2013-11-05 DIAGNOSIS — T1490XA Injury, unspecified, initial encounter: Secondary | ICD-10-CM

## 2013-11-07 ENCOUNTER — Inpatient Hospital Stay (HOSPITAL_COMMUNITY)
Admission: EM | Admit: 2013-11-07 | Discharge: 2013-11-10 | DRG: 393 | Disposition: A | Discharge status: Hospice | Payer: Medicare Other | Attending: Internal Medicine | Admitting: Internal Medicine

## 2013-11-07 ENCOUNTER — Emergency Department (HOSPITAL_COMMUNITY): Payer: Medicare Other

## 2013-11-07 ENCOUNTER — Encounter (HOSPITAL_COMMUNITY): Payer: Self-pay | Admitting: Emergency Medicine

## 2013-11-07 DIAGNOSIS — I2510 Atherosclerotic heart disease of native coronary artery without angina pectoris: Secondary | ICD-10-CM | POA: Diagnosis present

## 2013-11-07 DIAGNOSIS — R109 Unspecified abdominal pain: Secondary | ICD-10-CM

## 2013-11-07 DIAGNOSIS — I5022 Chronic systolic (congestive) heart failure: Secondary | ICD-10-CM | POA: Diagnosis present

## 2013-11-07 DIAGNOSIS — F0390 Unspecified dementia without behavioral disturbance: Secondary | ICD-10-CM | POA: Diagnosis present

## 2013-11-07 DIAGNOSIS — I509 Heart failure, unspecified: Secondary | ICD-10-CM | POA: Diagnosis present

## 2013-11-07 DIAGNOSIS — S22009A Unspecified fracture of unspecified thoracic vertebra, initial encounter for closed fracture: Secondary | ICD-10-CM | POA: Diagnosis present

## 2013-11-07 DIAGNOSIS — I10 Essential (primary) hypertension: Secondary | ICD-10-CM | POA: Diagnosis present

## 2013-11-07 DIAGNOSIS — M353 Polymyalgia rheumatica: Secondary | ICD-10-CM | POA: Diagnosis present

## 2013-11-07 DIAGNOSIS — IMO0002 Reserved for concepts with insufficient information to code with codable children: Secondary | ICD-10-CM | POA: Diagnosis present

## 2013-11-07 DIAGNOSIS — G9340 Encephalopathy, unspecified: Secondary | ICD-10-CM | POA: Diagnosis present

## 2013-11-07 DIAGNOSIS — E785 Hyperlipidemia, unspecified: Secondary | ICD-10-CM | POA: Diagnosis present

## 2013-11-07 DIAGNOSIS — I059 Rheumatic mitral valve disease, unspecified: Secondary | ICD-10-CM | POA: Diagnosis present

## 2013-11-07 DIAGNOSIS — Z79899 Other long term (current) drug therapy: Secondary | ICD-10-CM

## 2013-11-07 DIAGNOSIS — S322XXA Fracture of coccyx, initial encounter for closed fracture: Secondary | ICD-10-CM

## 2013-11-07 DIAGNOSIS — W010XXA Fall on same level from slipping, tripping and stumbling without subsequent striking against object, initial encounter: Secondary | ICD-10-CM | POA: Diagnosis present

## 2013-11-07 DIAGNOSIS — Z880 Allergy status to penicillin: Secondary | ICD-10-CM

## 2013-11-07 DIAGNOSIS — J690 Pneumonitis due to inhalation of food and vomit: Secondary | ICD-10-CM | POA: Diagnosis present

## 2013-11-07 DIAGNOSIS — Z888 Allergy status to other drugs, medicaments and biological substances status: Secondary | ICD-10-CM

## 2013-11-07 DIAGNOSIS — S3210XA Unspecified fracture of sacrum, initial encounter for closed fracture: Secondary | ICD-10-CM | POA: Diagnosis present

## 2013-11-07 DIAGNOSIS — Z66 Do not resuscitate: Secondary | ICD-10-CM | POA: Diagnosis present

## 2013-11-07 DIAGNOSIS — I252 Old myocardial infarction: Secondary | ICD-10-CM

## 2013-11-07 DIAGNOSIS — T18108A Unspecified foreign body in esophagus causing other injury, initial encounter: Principal | ICD-10-CM | POA: Diagnosis present

## 2013-11-07 DIAGNOSIS — K222 Esophageal obstruction: Secondary | ICD-10-CM | POA: Diagnosis present

## 2013-11-07 HISTORY — DX: Essential (primary) hypertension: I10

## 2013-11-07 LAB — CBC WITH DIFFERENTIAL/PLATELET
BASOS PCT: 0 % (ref 0–1)
Basophils Absolute: 0 10*3/uL (ref 0.0–0.1)
Eosinophils Absolute: 0 10*3/uL (ref 0.0–0.7)
Eosinophils Relative: 0 % (ref 0–5)
HEMATOCRIT: 41.3 % (ref 36.0–46.0)
HEMOGLOBIN: 13.9 g/dL (ref 12.0–15.0)
Lymphocytes Relative: 14 % (ref 12–46)
Lymphs Abs: 1.6 10*3/uL (ref 0.7–4.0)
MCH: 32.7 pg (ref 26.0–34.0)
MCHC: 33.7 g/dL (ref 30.0–36.0)
MCV: 97.2 fL (ref 78.0–100.0)
MONO ABS: 0.7 10*3/uL (ref 0.1–1.0)
MONOS PCT: 6 % (ref 3–12)
NEUTROS ABS: 9 10*3/uL — AB (ref 1.7–7.7)
Neutrophils Relative %: 79 % — ABNORMAL HIGH (ref 43–77)
Platelets: 357 10*3/uL (ref 150–400)
RBC: 4.25 MIL/uL (ref 3.87–5.11)
RDW: 13.4 % (ref 11.5–15.5)
WBC: 11.3 10*3/uL — ABNORMAL HIGH (ref 4.0–10.5)

## 2013-11-07 LAB — COMPREHENSIVE METABOLIC PANEL
ALBUMIN: 3.5 g/dL (ref 3.5–5.2)
ALT: 5 U/L (ref 0–35)
AST: 13 U/L (ref 0–37)
Alkaline Phosphatase: 191 U/L — ABNORMAL HIGH (ref 39–117)
BILIRUBIN TOTAL: 0.6 mg/dL (ref 0.3–1.2)
BUN: 19 mg/dL (ref 6–23)
CHLORIDE: 94 meq/L — AB (ref 96–112)
CO2: 26 meq/L (ref 19–32)
CREATININE: 0.98 mg/dL (ref 0.50–1.10)
Calcium: 9.2 mg/dL (ref 8.4–10.5)
GFR, EST AFRICAN AMERICAN: 58 mL/min — AB (ref >90)
GFR, EST NON AFRICAN AMERICAN: 50 mL/min — AB (ref >90)
GLUCOSE: 128 mg/dL — AB (ref 70–99)
Potassium: 4.3 mEq/L (ref 3.7–5.3)
Sodium: 132 mEq/L — ABNORMAL LOW (ref 137–147)
Total Protein: 6.9 g/dL (ref 6.0–8.3)

## 2013-11-07 LAB — URINALYSIS W MICROSCOPIC + REFLEX CULTURE
BILIRUBIN URINE: NEGATIVE
Glucose, UA: NEGATIVE mg/dL
Hgb urine dipstick: NEGATIVE
Ketones, ur: NEGATIVE mg/dL
LEUKOCYTES UA: NEGATIVE
NITRITE: NEGATIVE
PH: 7 (ref 5.0–8.0)
Protein, ur: NEGATIVE mg/dL
SPECIFIC GRAVITY, URINE: 1.027 (ref 1.005–1.030)
UROBILINOGEN UA: 1 mg/dL (ref 0.0–1.0)

## 2013-11-07 LAB — LIPASE, BLOOD: LIPASE: 16 U/L (ref 11–59)

## 2013-11-07 MED ORDER — IOHEXOL 300 MG/ML  SOLN
100.0000 mL | Freq: Once | INTRAMUSCULAR | Status: AC | PRN
  Administered 2013-11-07: 80 mL via INTRAVENOUS

## 2013-11-07 NOTE — Progress Notes (Signed)
   CARE MANAGEMENT ED NOTE 11/07/2013  Patient:  Rhea BeltonCOLLINS,Alanda S   Account Number:  [id number]  Date Initiated:  11/07/2013  Documentation initiated by:  Radford PaxFERRERO,Tecumseh Yeagley  Subjective/Objective Assessment:   Patient presents to Ed with increased weakness and confusion post fall earlier in the month.     Subjective/Objective Assessment Detail:   Patient with pmhx of HTN and CAD.     Action/Plan:   Action/Plan Detail:   Anticipated DC Date:       Status Recommendation to Physician:   Result of Recommendation:    Other ED Services  Consult Working Plan    DC Planning Services  Other  PCP issues    Choice offered to / List presented to:            Status of service:  Completed, signed off  ED Comments:   ED Comments Detail:  EDCM spoke to patient's daughter June at bedside.  As per June, patient's pcp is Dr. Joette CatchingLeonard Nyland.  System updated.

## 2013-11-07 NOTE — ED Notes (Signed)
Bed: WA20 Expected date:  Expected time:  Means of arrival:  Comments: 80 yo F  abd pain

## 2013-11-07 NOTE — ED Notes (Signed)
Per pt daughter pt fell on Jan 17th and has been weak with confusion since. Pt has not had bowel movement in over a week and is having abdominal pain.

## 2013-11-07 NOTE — ED Provider Notes (Signed)
CSN: [id number]     Arrival date & time 11/07/13  2055 History   First MD Initiated Contact with Patient 11/07/13 2112     Chief Complaint  Patient presents with  . Abdominal Pain   (Consider location/radiation/quality/duration/timing/severity/associated sxs/prior Treatment) Patient is a 80 y.o. female presenting with abdominal pain. The history is provided by the patient.  Abdominal Pain Pain location:  Generalized Pain quality comment:  Unable to specify Pain radiates to:  Does not radiate Pain severity:  Mild Onset quality:  Gradual Duration:  2 days Timing:  Constant Progression:  Unchanged Chronicity:  New Context comment:  At rest Relieved by:  Nothing Worsened by:  Nothing tried Ineffective treatments:  None tried Associated symptoms: constipation   Associated symptoms: no chest pain, no cough, no diarrhea, no dysuria, no fatigue, no fever, no hematuria, no nausea, no shortness of breath and no vomiting     Past Medical History  Diagnosis Date  . CAD (coronary artery disease)     s/p MI in 2005 -- has bare metal stent in the R coronary artery  . Systolic dysfunction   . Dyspnea on exertion   . Orthostatic hypotension   . HLD (hyperlipidemia)   . Moderate mitral regurgitation by prior echocardiogram   . Hypertension    Past Surgical History  Procedure Laterality Date  . Colonoscopy w/ polypectomy    . Ectropion repair      left lower eyelid   Family History  Problem Relation Age of Onset  . Appendicitis Mother   . Cancer Father   . Lung cancer Brother   . Stroke Sister   . COPD Sister    History  Substance Use Topics  . Smoking status: Never Smoker   . Smokeless tobacco: Not on file  . Alcohol Use: No   OB History   Grav Para Term Preterm Abortions TAB SAB Ect Mult Living                 Review of Systems  Constitutional: Negative for fever and fatigue.  HENT: Negative for congestion and drooling.   Eyes: Negative for pain.  Respiratory:  Negative for cough and shortness of breath.   Cardiovascular: Negative for chest pain.  Gastrointestinal: Positive for abdominal pain and constipation. Negative for nausea, vomiting and diarrhea.  Genitourinary: Negative for dysuria and hematuria.  Musculoskeletal: Positive for back pain. Negative for gait problem and neck pain.  Skin: Negative for color change.  Neurological: Negative for dizziness and headaches.       Fall  Hematological: Negative for adenopathy.  Psychiatric/Behavioral: Negative for behavioral problems.  All other systems reviewed and are negative.    Allergies  Meperidine hcl and Penicillins  Home Medications   Current Outpatient Rx  Name  Route  Sig  Dispense  Refill  . Ascorbic Acid (VITAMIN C) 1000 MG tablet   Oral   Take 1,000 mg by mouth daily.           Marland Kitchen aspirin 81 MG tablet   Oral   Take 81 mg by mouth every other day.          . calcium carbonate (OS-CAL) 600 MG TABS tablet   Oral   Take 600 mg by mouth daily with breakfast.         . carvedilol (COREG) 3.125 MG tablet      TAKE  (1)  TABLET TWICE A DAY.   180 tablet   0     NO  FURTHER REFILLS WITHOUT APPOINTMENT   . cholecalciferol (VITAMIN D) 1000 UNITS tablet   Oral   Take 1,000 Units by mouth daily.         Marland Kitchen. docusate sodium (COLACE) 100 MG capsule   Oral   Take 100 mg by mouth 2 (two) times daily.           . furosemide (LASIX) 20 MG tablet      TAKE 1 TABLET DAILY;MAY TAKE EXTRA DOSE ONLY IF WEIGHT GAIN OF 3 POUNDS   30 tablet   0     NO FURTHER REFILLS WITHOUT APPOINTMENT   . hydrALAZINE (APRESOLINE) 10 MG tablet   Oral   Take 1 tablet (10 mg total) by mouth 2 (two) times daily.   180 tablet   3   . HYDROcodone-acetaminophen (VICODIN) 5-500 MG per tablet   Oral   Take 1 tablet by mouth every 8 (eight) hours as needed.         . nitroGLYCERIN (NITROSTAT) 0.4 MG SL tablet   Sublingual   Place 0.4 mg under the tongue every 5 (five) minutes as needed.            . potassium chloride SA (K-DUR) 20 MEQ tablet   Oral   Take 1 tablet (20 mEq total) by mouth daily.   90 tablet   3   . predniSONE (DELTASONE) 5 MG tablet   Oral   Take 5 mg by mouth daily.           Marland Kitchen. glycerin adult (GLYCERIN ADULT) 2 G SUPP   Rectal   Place 1 suppository rectally once.         . polyethylene glycol (MIRALAX / GLYCOLAX) packet   Oral   Take 17 g by mouth once.          BP 130/69  Pulse 71  Temp(Src) 98.2 F (36.8 C) (Oral)  Resp 16  SpO2 97% Physical Exam  Nursing note and vitals reviewed. Constitutional: She appears well-developed and well-nourished.  HENT:  Head: Normocephalic.  Mouth/Throat: Oropharynx is clear and moist. No oropharyngeal exudate.  Eyes: Conjunctivae and EOM are normal. Pupils are equal, round, and reactive to light.  Neck: Normal range of motion. Neck supple.  No focal cervical tenderness to palpation. The patient has diffuse thoracic and lumbar tenderness to palpation.  Cardiovascular: Normal rate, regular rhythm, normal heart sounds and intact distal pulses.  Exam reveals no gallop and no friction rub.   No murmur heard. Pulmonary/Chest: Effort normal and breath sounds normal. No respiratory distress. She has no wheezes.  Abdominal: Soft. Bowel sounds are normal. There is tenderness (diffuse mild non-focal ttp). There is no rebound and no guarding.  Musculoskeletal: Normal range of motion. She exhibits no edema and no tenderness.  No focal ttp of hips bilaterally.     Neurological: She is alert.  A/o x2. Doesn't know year.   Skin: Skin is warm and dry.  Psychiatric: She has a normal mood and affect. Her behavior is normal.    ED Course  Procedures (including critical care time) Labs Review Labs Reviewed  CBC WITH DIFFERENTIAL - Abnormal; Notable for the following:    WBC 11.3 (*)    Neutrophils Relative % 79 (*)    Neutro Abs 9.0 (*)    All other components within normal limits  COMPREHENSIVE  METABOLIC PANEL - Abnormal; Notable for the following:    Sodium 132 (*)    Chloride 94 (*)  Glucose, Bld 128 (*)    Alkaline Phosphatase 191 (*)    GFR calc non Af Amer 50 (*)    GFR calc Af Amer 58 (*)    All other components within normal limits  URINALYSIS W MICROSCOPIC + REFLEX CULTURE - Abnormal; Notable for the following:    Casts HYALINE CASTS (*)    Crystals CA OXALATE CRYSTALS (*)    All other components within normal limits  LIPASE, BLOOD   Imaging Review Dg Chest 2 View  11/07/2013   CLINICAL DATA:  Fall  EXAM: CHEST  2 VIEW  COMPARISON:  11/25/2010 CT  FINDINGS: A metallic ring projects over the midline at the thoracic inlet. Aortic atherosclerosis. There is cardiac enlargement. Left greater than right pleural effusions. No pneumothorax. Limited osseous evaluation due to severe osteopenia. There are multilevel compression deformities that are poorly evaluated on the study. See contemporaneous thoracic spine radiograph report.  IMPRESSION: Left greater right pleural effusions.  Cardiomegaly.  A metallic ring projects over the midline at the thoracic inlet and presumed external. Confirm clinically.   Electronically Signed   By: Jearld Lesch M.D.   On: 11/07/2013 22:27   Dg Lumbar Spine Complete  11/07/2013   CLINICAL DATA:  Fall, low back pain  EXAM: LUMBAR SPINE - COMPLETE 4+ VIEW  COMPARISON:  09/17/2011 CT  FINDINGS: Severe diffuse osteopenia. There are multilevel compression fractures including L2, L1, T11, T10. Similar to prior. Rightward curvature of the lumbar spine. A nondisplaced fracture of the lower sacrum is suspected  IMPRESSION: T10, T11, L1, L2 vertebral body compression fractures are similar to prior.  Nondisplaced lower sacral fracture suspected.   Electronically Signed   By: Jearld Lesch M.D.   On: 11/07/2013 22:36   Dg Pelvis 1-2 Views  11/07/2013   CLINICAL DATA:  Fall  EXAM: PELVIS - 1-2 VIEW  COMPARISON:  09/17/2011 CT  FINDINGS: Osteopenia. No  displaced pelvic fracture or dislocation identified. Sacrum obscured by overlying bowel.  IMPRESSION: Sacrum obscured by overlying bowel and cannot be evaluated. Otherwise, no displaced fracture of the pelvis identified.   Electronically Signed   By: Jearld Lesch M.D.   On: 11/07/2013 22:32    EKG Interpretation    Date/Time:  Wednesday November 07 2013 21:54:03 EST Ventricular Rate:  73 PR Interval:  170 QRS Duration: 147 QT Interval:  475 QTC Calculation: 523 R Axis:   -93 Text Interpretation:  Sinus rhythm Nonspecific IVCD with LAD Inferior infarct, old Anterior infarct, old Baseline wander in lead(s) II III aVF No significant change since last tracing Confirmed by Lean Fayson  MD, Sun Kihn ([id number]) on 11/07/2013 10:44:03 PM            MDM   1. Compression fracture   2. Sacral fracture   3. Foreign body in esophagus   4. Aspiration pneumonia    10:42 PM 80 y.o. female who presents with altered mental status. The family notes that the patient had a mechanical fall on January 17. They state that she fell onto her bottom and she denies hitting her head at the time. She was not evaluated by a physician at that time. They note that she has had increasing confusion over the last several days and has been complaining of nonspecific abdominal pain over the last 2 days. She's not had a bowel movement in approximately one week per the family. She is afebrile and vital signs are unremarkable here. Will get screening imaging to rule out traumatic injury as the  patient has diffuse tenderness to palpation of her thoracic and lumbar spine. Will get lab work and CT of abdomen as well due to nonspecific abdominal pain.  Pt found to have pna, suspected to be d/t aspiration. Will tx w/ clindamycin as family notes unknown allergic reaction to penicillins. Pt incidentally found to have a metallic ring foreign body in her esophagus. I discussed this with the on-call GI doctor for equal. As the patient is in  no distress and handling her secretions appropriately the plan will be for EGD in the morning. The patient was also found to have a nondisplaced suspected sacral fracture and possibly new thoracic spine compression fractures. She is sleeping comfortably currently on exam.  The patient will be admitted to [facility] hospital[facility].     Junius Argyle, MD 11/08/13 0003

## 2013-11-07 NOTE — ED Notes (Signed)
Pt currently in Radiology, will complete orders when pts returns.

## 2013-11-08 ENCOUNTER — Encounter (HOSPITAL_COMMUNITY)
Admission: EM | Disposition: A | Discharge status: Hospice | Payer: Self-pay | Source: Home / Self Care | Attending: Internal Medicine

## 2013-11-08 ENCOUNTER — Inpatient Hospital Stay (HOSPITAL_COMMUNITY): Payer: Medicare Other

## 2013-11-08 ENCOUNTER — Encounter (HOSPITAL_COMMUNITY): Payer: Self-pay | Admitting: Internal Medicine

## 2013-11-08 DIAGNOSIS — T148XXA Other injury of unspecified body region, initial encounter: Secondary | ICD-10-CM

## 2013-11-08 DIAGNOSIS — I5022 Chronic systolic (congestive) heart failure: Secondary | ICD-10-CM

## 2013-11-08 DIAGNOSIS — M353 Polymyalgia rheumatica: Secondary | ICD-10-CM | POA: Diagnosis present

## 2013-11-08 DIAGNOSIS — J690 Pneumonitis due to inhalation of food and vomit: Secondary | ICD-10-CM

## 2013-11-08 DIAGNOSIS — G9340 Encephalopathy, unspecified: Secondary | ICD-10-CM

## 2013-11-08 DIAGNOSIS — T18108A Unspecified foreign body in esophagus causing other injury, initial encounter: Principal | ICD-10-CM

## 2013-11-08 HISTORY — PX: FOREIGN BODY REMOVAL: SHX962

## 2013-11-08 HISTORY — PX: ESOPHAGOGASTRODUODENOSCOPY: SHX5428

## 2013-11-08 LAB — CK: Total CK: 18 U/L (ref 7–177)

## 2013-11-08 LAB — CBC
HCT: 37.4 % (ref 36.0–46.0)
HEMOGLOBIN: 12.7 g/dL (ref 12.0–15.0)
MCH: 32.6 pg (ref 26.0–34.0)
MCHC: 34 g/dL (ref 30.0–36.0)
MCV: 96.1 fL (ref 78.0–100.0)
PLATELETS: 324 10*3/uL (ref 150–400)
RBC: 3.89 MIL/uL (ref 3.87–5.11)
RDW: 13.5 % (ref 11.5–15.5)
WBC: 9 10*3/uL (ref 4.0–10.5)

## 2013-11-08 LAB — COMPREHENSIVE METABOLIC PANEL
ALT: 5 U/L (ref 0–35)
AST: 13 U/L (ref 0–37)
Albumin: 3.1 g/dL — ABNORMAL LOW (ref 3.5–5.2)
Alkaline Phosphatase: 168 U/L — ABNORMAL HIGH (ref 39–117)
BILIRUBIN TOTAL: 0.7 mg/dL (ref 0.3–1.2)
BUN: 19 mg/dL (ref 6–23)
CALCIUM: 8.5 mg/dL (ref 8.4–10.5)
CHLORIDE: 92 meq/L — AB (ref 96–112)
CO2: 26 meq/L (ref 19–32)
Creatinine, Ser: 0.9 mg/dL (ref 0.50–1.10)
GFR calc Af Amer: 65 mL/min — ABNORMAL LOW (ref >90)
GFR, EST NON AFRICAN AMERICAN: 56 mL/min — AB (ref >90)
Glucose, Bld: 98 mg/dL (ref 70–99)
Potassium: 3.6 mEq/L — ABNORMAL LOW (ref 3.7–5.3)
Sodium: 132 mEq/L — ABNORMAL LOW (ref 137–147)
Total Protein: 5.9 g/dL — ABNORMAL LOW (ref 6.0–8.3)

## 2013-11-08 LAB — TROPONIN I: Troponin I: <0.3 ng/mL (ref <0.30)

## 2013-11-08 LAB — RAPID URINE DRUG SCREEN, HOSP PERFORMED
Amphetamines: NOT DETECTED
Barbiturates: NOT DETECTED
Benzodiazepines: NOT DETECTED
COCAINE: NOT DETECTED
Opiates: POSITIVE — AB
Tetrahydrocannabinol: NOT DETECTED

## 2013-11-08 LAB — VITAMIN B12: Vitamin B-12: 205 pg/mL — ABNORMAL LOW (ref 211–911)

## 2013-11-08 LAB — GLUCOSE, CAPILLARY: Glucose-Capillary: 94 mg/dL (ref 70–99)

## 2013-11-08 LAB — TSH: TSH: 1.697 u[IU]/mL (ref 0.350–4.500)

## 2013-11-08 LAB — AMMONIA: Ammonia: 10 umol/L — ABNORMAL LOW (ref 11–60)

## 2013-11-08 SURGERY — EGD (ESOPHAGOGASTRODUODENOSCOPY)
Anesthesia: Moderate Sedation

## 2013-11-08 MED ORDER — DIPHENHYDRAMINE HCL 50 MG/ML IJ SOLN
INTRAMUSCULAR | Status: AC
  Filled 2013-11-08: qty 1

## 2013-11-08 MED ORDER — CLINDAMYCIN PHOSPHATE 600 MG/50ML IV SOLN
600.0000 mg | Freq: Three times a day (TID) | INTRAVENOUS | Status: DC
  Filled 2013-11-08 (×2): qty 50

## 2013-11-08 MED ORDER — CLINDAMYCIN PHOSPHATE 600 MG/50ML IV SOLN
600.0000 mg | Freq: Once | INTRAVENOUS | Status: AC
  Administered 2013-11-08: 600 mg via INTRAVENOUS
  Filled 2013-11-08: qty 50

## 2013-11-08 MED ORDER — ASPIRIN 81 MG PO CHEW
81.0000 mg | CHEWABLE_TABLET | Freq: Every day | ORAL | Status: DC
  Administered 2013-11-08 – 2013-11-10 (×3): 81 mg via ORAL
  Filled 2013-11-08 (×3): qty 1

## 2013-11-08 MED ORDER — CARVEDILOL 3.125 MG PO TABS
3.1250 mg | ORAL_TABLET | Freq: Two times a day (BID) | ORAL | Status: DC
  Administered 2013-11-08 – 2013-11-10 (×4): 3.125 mg via ORAL
  Filled 2013-11-08 (×6): qty 1

## 2013-11-08 MED ORDER — HYDROMORPHONE HCL PF 1 MG/ML IJ SOLN: 0.5000 mg | INTRAMUSCULAR | Status: DC | PRN

## 2013-11-08 MED ORDER — FENTANYL CITRATE 0.05 MG/ML IJ SOLN
INTRAMUSCULAR | Status: AC
  Filled 2013-11-08: qty 2

## 2013-11-08 MED ORDER — METHYLPREDNISOLONE SODIUM SUCC 40 MG IJ SOLR
20.0000 mg | Freq: Every day | INTRAMUSCULAR | Status: DC
  Administered 2013-11-08: 20 mg via INTRAVENOUS
  Filled 2013-11-08 (×2): qty 0.5

## 2013-11-08 MED ORDER — HYDRALAZINE HCL 20 MG/ML IJ SOLN: 10.0000 mg | INTRAMUSCULAR | Status: DC | PRN

## 2013-11-08 MED ORDER — SODIUM CHLORIDE 0.9 % IV SOLN
INTRAVENOUS | Status: DC
  Administered 2013-11-08: 500 mL via INTRAVENOUS

## 2013-11-08 MED ORDER — MIDAZOLAM HCL 10 MG/2ML IJ SOLN
INTRAMUSCULAR | Status: AC
  Filled 2013-11-08: qty 2

## 2013-11-08 MED ORDER — SODIUM CHLORIDE 0.9 % IV SOLN
INTRAVENOUS | Status: DC
  Administered 2013-11-08: 1000 mL via INTRAVENOUS
  Administered 2013-11-09: 19:00:00 via INTRAVENOUS

## 2013-11-08 MED ORDER — METOPROLOL TARTRATE 1 MG/ML IV SOLN
2.5000 mg | Freq: Three times a day (TID) | INTRAVENOUS | Status: DC
  Administered 2013-11-08: 2.5 mg via INTRAVENOUS
  Filled 2013-11-08 (×5): qty 5

## 2013-11-08 MED ORDER — PANTOPRAZOLE SODIUM 40 MG IV SOLR
40.0000 mg | Freq: Every day | INTRAVENOUS | Status: DC
  Administered 2013-11-08 (×2): 40 mg via INTRAVENOUS
  Filled 2013-11-08 (×3): qty 40

## 2013-11-08 MED ORDER — LEVOFLOXACIN IN D5W 500 MG/100ML IV SOLN: 500.0000 mg | INTRAVENOUS | Status: DC

## 2013-11-08 MED ORDER — ACETAMINOPHEN 325 MG PO TABS: 650.0000 mg | ORAL_TABLET | ORAL | Status: DC | PRN

## 2013-11-08 MED ORDER — FENTANYL CITRATE 0.05 MG/ML IJ SOLN
INTRAMUSCULAR | Status: DC | PRN
  Administered 2013-11-08: 12.5 ug via INTRAVENOUS

## 2013-11-08 MED ORDER — MIDAZOLAM HCL 10 MG/2ML IJ SOLN
INTRAMUSCULAR | Status: DC | PRN
  Administered 2013-11-08: 1 mg via INTRAVENOUS

## 2013-11-08 MED ORDER — LEVOFLOXACIN IN D5W 750 MG/150ML IV SOLN
750.0000 mg | INTRAVENOUS | Status: DC
  Administered 2013-11-08: 750 mg via INTRAVENOUS
  Filled 2013-11-08: qty 150

## 2013-11-08 MED ORDER — NITROGLYCERIN 0.4 MG SL SUBL: 0.4000 mg | SUBLINGUAL_TABLET | SUBLINGUAL | Status: DC | PRN

## 2013-11-08 NOTE — H&P (Signed)
[facility] History and Physical  [name] ZOX:[id number] DOB: 04-28-1924 DOA: 11/07/2013  Referring physician: ER physician. PCP: Josue Hector, MD  Specialists: [facility] cardiology.  Chief Complaint: Abdominal pain and nausea.  HPI: [name] is a 80 y.o. female with history of CAD, chronic systolic heart failure last EF was 35-40%, rheumatic on prednisone, hypertension was brought to the ER after patient was having nausea and abdominal pain. In addition patient was found confused and patient's daughter who provided most of the history states that patient has been confused over the last week or so. Patient had a fall 2 days ago when she slipped and fell. She fell onto her back but did not hit her head and did not lose consciousness. Patient otherwise did not have any chest pain or shortness of breath. In the ER patient had extensive studies including CT head, neck on CT abdomen pelvis chest x-ray. Studies showed that patient has a metallic ring in her esophagus. Patient otherwise was found to have possible aspiration pneumonia and age indeterminant fractures of the thoracic spine and has sacral fracture. Patient on my exam is oriented to her name and follows commands. Patient did not complain of any chest pain or shortness of breath. On-call gastroenterologist was consulted by ER physician and they will be seeing patient in consult for the swallowed metallic ring.  Review of Systems: As presented in the history of presenting illness, rest negative.  Past Medical History  Diagnosis Date  . CAD (coronary artery disease)     s/p MI in 2005 -- has bare metal stent in the R coronary artery  . Systolic dysfunction   . Dyspnea on exertion   . Orthostatic hypotension   . HLD (hyperlipidemia)   . Moderate mitral regurgitation by prior echocardiogram   . Hypertension    Past Surgical History  Procedure Laterality Date  . Colonoscopy w/ polypectomy    . Ectropion repair       left lower eyelid  . Cardiac catheterization     Social History:  reports that she has never smoked. She does not have any smokeless tobacco history on file. She reports that she does not drink alcohol or use illicit drugs. Where does patient live home. Can patient participate in ADLs? Yes.  Allergies  Allergen Reactions  . Meperidine Hcl Swelling    Swelling of the neck  . Penicillins Rash    Family History:  Family History  Problem Relation Age of Onset  . Appendicitis Mother   . Cancer Father   . Lung cancer Brother   . Stroke Sister   . COPD Sister       Prior to Admission medications   Medication Sig Start Date End Date Taking? Authorizing Provider  Ascorbic Acid (VITAMIN C) 1000 MG tablet Take 1,000 mg by mouth daily.     Yes Historical Provider, MD  aspirin 81 MG tablet Take 81 mg by mouth every other day.    Yes Historical Provider, MD  calcium carbonate (OS-CAL) 600 MG TABS tablet Take 600 mg by mouth daily with breakfast.   Yes Historical Provider, MD  carvedilol (COREG) 3.125 MG tablet TAKE  (1)  TABLET TWICE A DAY. 08/28/13  Yes Lars Masson, MD  cholecalciferol (VITAMIN D) 1000 UNITS tablet Take 1,000 Units by mouth daily.   Yes Historical Provider, MD  docusate sodium (COLACE) 100 MG capsule Take 100 mg by mouth 2 (two) times daily.     Yes Historical Provider, MD  furosemide (LASIX) 20 MG tablet TAKE 1 TABLET DAILY;MAY TAKE EXTRA DOSE ONLY IF WEIGHT GAIN OF 3 POUNDS 09/13/13  Yes Lars MassonKatarina H Nelson, MD  hydrALAZINE (APRESOLINE) 10 MG tablet Take 1 tablet (10 mg total) by mouth 2 (two) times daily. 02/09/13  Yes Gaylord Shihhomas C Wall, MD  HYDROcodone-acetaminophen (VICODIN) 5-500 MG per tablet Take 1 tablet by mouth every 8 (eight) hours as needed.   Yes Historical Provider, MD  nitroGLYCERIN (NITROSTAT) 0.4 MG SL tablet Place 0.4 mg under the tongue every 5 (five) minutes as needed.     Yes Historical Provider, MD  potassium chloride SA (K-DUR) 20 MEQ tablet Take 1  tablet (20 mEq total) by mouth daily. 02/09/13  Yes Gaylord Shihhomas C Wall, MD  predniSONE (DELTASONE) 5 MG tablet Take 5 mg by mouth daily.     Yes Historical Provider, MD  glycerin adult (GLYCERIN ADULT) 2 G SUPP Place 1 suppository rectally once.    Historical Provider, MD  polyethylene glycol (MIRALAX / GLYCOLAX) packet Take 17 g by mouth once.    Historical Provider, MD    Physical Exam: Filed Vitals:   11/07/13 2055 11/08/13 0057  BP: 130/69   Pulse: 71   Temp: 98.2 F (36.8 C) 98.2 F (36.8 C)  TempSrc: Oral   Resp: 16   SpO2: 97%      General:  Well developed and moderately nourished.  Eyes: Anicteric no pallor.  ENT: No discharge from the ears eyes nose mouth.  Neck: No mass felt.  Cardiovascular: S1-S2 heard.  Respiratory: No rhonchi or crepitations.  Abdomen: Soft nontender bowel sounds present.  Skin: No rash.  Musculoskeletal: No edema.  Psychiatric: Patient is oriented to her name.  Neurologic: Patient is oriented to her name and follows commands. Moves all extremities.  Labs on Admission:  Basic Metabolic Panel:  Recent Labs Lab 11/07/13 2120  NA 132*  K 4.3  CL 94*  CO2 26  GLUCOSE 128*  BUN 19  CREATININE 0.98  CALCIUM 9.2   Liver Function Tests:  Recent Labs Lab 11/07/13 2120  AST 13  ALT 5  ALKPHOS 191*  BILITOT 0.6  PROT 6.9  ALBUMIN 3.5    Recent Labs Lab 11/07/13 2120  LIPASE 16   No results found for this basename: AMMONIA,  in the last 168 hours CBC:  Recent Labs Lab 11/07/13 2120  WBC 11.3*  NEUTROABS 9.0*  HGB 13.9  HCT 41.3  MCV 97.2  PLT 357   Cardiac Enzymes: No results found for this basename: CKTOTAL, CKMB, CKMBINDEX, TROPONINI,  in the last 168 hours  BNP (last 3 results) No results found for this basename: PROBNP,  in the last 8760 hours CBG: No results found for this basename: GLUCAP,  in the last 168 hours  Radiological Exams on Admission: Dg Chest 2 View  11/07/2013   CLINICAL DATA:  Fall  EXAM:  CHEST  2 VIEW  COMPARISON:  11/25/2010 CT  FINDINGS: A metallic ring projects over the midline at the thoracic inlet. Aortic atherosclerosis. There is cardiac enlargement. Left greater than right pleural effusions. No pneumothorax. Limited osseous evaluation due to severe osteopenia. There are multilevel compression deformities that are poorly evaluated on the study. See contemporaneous thoracic spine radiograph report.  IMPRESSION: Left greater right pleural effusions.  Cardiomegaly.  A metallic ring projects over the midline at the thoracic inlet and presumed external. Confirm clinically.   Electronically Signed   By: Jearld LeschAndrew  DelGaizo M.D.   On: 11/07/2013 22:27  Dg Thoracic Spine 2 View  11/07/2013   CLINICAL DATA:  Fall, back pain  EXAM: THORACIC SPINE - 2 VIEW  COMPARISON:  11/25/2010 chest CT  FINDINGS: See separate lumbar spine radiograph report. Diffuse osteopenia. Compression fractures at T11, T10, T8, T7, T6. Mild kyphosis. Mild rightward curvature.  IMPRESSION: 50% height loss T7 and T8 compression fractures, not appreciated in 2012. Correlate for point tenderness. Otherwise, compression fractures are similar to prior.   Electronically Signed   By: Jearld Lesch M.D.   On: 11/07/2013 22:40   Dg Lumbar Spine Complete  11/07/2013   CLINICAL DATA:  Fall, low back pain  EXAM: LUMBAR SPINE - COMPLETE 4+ VIEW  COMPARISON:  09/17/2011 CT  FINDINGS: Severe diffuse osteopenia. There are multilevel compression fractures including L2, L1, T11, T10. Similar to prior. Rightward curvature of the lumbar spine. A nondisplaced fracture of the lower sacrum is suspected  IMPRESSION: T10, T11, L1, L2 vertebral body compression fractures are similar to prior.  Nondisplaced lower sacral fracture suspected.   Electronically Signed   By: Jearld Lesch M.D.   On: 11/07/2013 22:36   Dg Pelvis 1-2 Views  11/07/2013   CLINICAL DATA:  Fall  EXAM: PELVIS - 1-2 VIEW  COMPARISON:  09/17/2011 CT  FINDINGS: Osteopenia. No  displaced pelvic fracture or dislocation identified. Sacrum obscured by overlying bowel.  IMPRESSION: Sacrum obscured by overlying bowel and cannot be evaluated. Otherwise, no displaced fracture of the pelvis identified.   Electronically Signed   By: Jearld Lesch M.D.   On: 11/07/2013 22:32   Ct Head Wo Contrast  11/07/2013   CLINICAL DATA:  Altered mental status, recent fall  EXAM: CT HEAD WITHOUT CONTRAST  CT CERVICAL SPINE WITHOUT CONTRAST  TECHNIQUE: Multidetector CT imaging of the head and cervical spine was performed following the standard protocol without intravenous contrast. Multiplanar CT image reconstructions of the cervical spine were also generated.  COMPARISON:  09/20/2006  FINDINGS: CT HEAD FINDINGS  Prominence of the sulci, cisterns, and ventricles, in keeping with volume loss. No overt hydrocephalus. No definite CT evidence of an acute infarction. Periventricular and subcortical white matter hypodensities, a nonspecific finding often seen in the setting of chronic microangiopathic change. No intraparenchymal hemorrhage, mass, mass effect, or abnormal extra-axial fluid collection. Atherosclerotic vascular calcifications. Visualized paranasal sinuses and mastoid air cells are predominantly clear. Lens replacements. No displaced calvarial fracture.  CT CERVICAL SPINE FINDINGS  Diffuse osteopenia. Maintained craniocervical relationship. No dens fracture. Minimal retrolisthesis of C5 on C6. Disc osteophyte complexes at C4-5, C5-6, and C6-7 result in mild-to-moderate central canal narrowing. There is a 1.8 cm metallic ring within the upper esophagus, resulting in significant streak artifact at the level of the C7 vertebral body. Mild apical scarring.  IMPRESSION: Volume loss and white matter changes as above. No definite CT evidence of an acute intracranial abnormality  Mild multilevel degenerative changes. No acute osseous finding of the cervical spine.  Metallic ring within the upper esophagus  may reflect an ingested foreign body. Correlate clinically.   Electronically Signed   By: Jearld Lesch M.D.   On: 11/07/2013 22:59   Ct Cervical Spine Wo Contrast  11/07/2013   CLINICAL DATA:  Altered mental status, recent fall  EXAM: CT HEAD WITHOUT CONTRAST  CT CERVICAL SPINE WITHOUT CONTRAST  TECHNIQUE: Multidetector CT imaging of the head and cervical spine was performed following the standard protocol without intravenous contrast. Multiplanar CT image reconstructions of the cervical spine were also generated.  COMPARISON:  09/20/2006  FINDINGS: CT HEAD FINDINGS  Prominence of the sulci, cisterns, and ventricles, in keeping with volume loss. No overt hydrocephalus. No definite CT evidence of an acute infarction. Periventricular and subcortical white matter hypodensities, a nonspecific finding often seen in the setting of chronic microangiopathic change. No intraparenchymal hemorrhage, mass, mass effect, or abnormal extra-axial fluid collection. Atherosclerotic vascular calcifications. Visualized paranasal sinuses and mastoid air cells are predominantly clear. Lens replacements. No displaced calvarial fracture.  CT CERVICAL SPINE FINDINGS  Diffuse osteopenia. Maintained craniocervical relationship. No dens fracture. Minimal retrolisthesis of C5 on C6. Disc osteophyte complexes at C4-5, C5-6, and C6-7 result in mild-to-moderate central canal narrowing. There is a 1.8 cm metallic ring within the upper esophagus, resulting in significant streak artifact at the level of the C7 vertebral body. Mild apical scarring.  IMPRESSION: Volume loss and white matter changes as above. No definite CT evidence of an acute intracranial abnormality  Mild multilevel degenerative changes. No acute osseous finding of the cervical spine.  Metallic ring within the upper esophagus may reflect an ingested foreign body. Correlate clinically.   Electronically Signed   By: Jearld Lesch M.D.   On: 11/07/2013 22:59   Ct Abdomen  Pelvis W Contrast  11/07/2013   CLINICAL DATA:  Fall, diffuse abdominal pain  EXAM: CT ABDOMEN AND PELVIS WITH CONTRAST  TECHNIQUE: Multidetector CT imaging of the abdomen and pelvis was performed using the standard protocol following bolus administration of intravenous contrast.  CONTRAST:  80mL OMNIPAQUE IOHEXOL 300 MG/ML  SOLN  COMPARISON:  09/17/2011  FINDINGS: Degraded by respiratory motion. Further degraded by extremity positioning.  Cardiac enlargement. Small pericardial effusion. Coronary artery calcifications. Leftward shift of the heart. Left lower lobe linear opacity, most in keeping with atelectasis or scarring.  Nonspecific hypodensities within the liver are similar to prior with the exception of a previously noted hepatic dome cyst which is not seen today. No appreciable abnormality of the spleen, pancreas, biliary system, adrenal glands. Symmetric renal enhancement. No hydroureteronephrosis.  Large stool burden. No overt colitis. No bowel obstruction. Decompressed stomach with nonspecific mild wall thickening, similar to prior. No free intraperitoneal air. Small amount of free fluid dependently within the pelvis.  Thin walled bladder.  Uterus not seen.  No adnexal mass.  Scattered atherosclerosis of the aorta and branch vessels without aneurysmal dilatation.  Diffuse osteopenia. A nondisplaced lower sacral fracture is again suspected on series 2, image 54. The L5 transverse process articulates with the iliac bone on the left, similar to prior. L2, L1, T11, T10 compression fractures are similar to prior.  IMPRESSION: Degraded by motion.  A nondisplaced lower sacral fracture is suspected. Correlate clinically.  Lower thoracic and lumbar compression fractures are similar prior.  Left lower lobe opacity may reflect atelectasis, pneumonia, or aspiration.  Cardiomegaly.   Electronically Signed   By: Jearld Lesch M.D.   On: 11/07/2013 23:18    EKG: Independently reviewed. Normal sinus rhythm with  intraventricular conduction defects comparable to old EKG.  Assessment/Plan Active Problems:   CAD, NATIVE VESSEL   SYSTOLIC HEART FAILURE, CHRONIC   Sacral fracture   Compression fracture   Foreign body in esophagus   Aspiration pneumonia   Polymyalgia rheumatica   Acute encephalopathy   1. Acute encephalopathy - the cause not clear. Stroke could be under one of the differentials. At this time patient has a metallic ring in her esophagus. So MRI cannot be done until it is removed. For now I have placed patient on neurochecks and swallow  evaluation. Patient otherwise will be n.p.o. due to the the metallic ring in the esophagus. Check ammonia levels and TSH. Closely observe. Treat for aspiration pneumonia. MRI brain the patient's mental status does not improve once metallic ring is removed. 2. Foreign body in the esophagus - patient has been kept n.p.o. Gastroenterologist to see. 3. Aspiration pneumonia - patient has been placed on clindamycin. Closely follow respiratory status. 4. Sacral fracture and age indeterminant thoracic fractures - physical therapy has been consulted. May discuss with orthopedics in a.m. 5. History of systolic heart failure last EF measured in 2007 was 35-40% - patient is usually on Lasix. Presently on hold. Closely follow respiratory status and may need Lasix as needed. 6. Hypertension - since patient is n.p.o. I have placed patient on scheduled metoprolol every 8 hourly 2.5 mg. Patient also on IV hydralazine when necessary for systolic blood pressure more than 160. 7. History of polymyalgia rheumatica - I have placed patient on IV Solu-Medrol until patient can swallow oral prednisone. 8. History of CAD.  I have reviewed patient's old charts and labs.  Code Status: Full code.  Family Communication: Patient's daughter at the bedside.  Disposition Plan: Admit to inpatient.    Muadh Creasy N. [facility] Pager [phone].  If 7PM-7AM, please contact  night-coverage [email or web address] Password [email or web address] 11/08/2013, 1:17 AM

## 2013-11-08 NOTE — Progress Notes (Signed)
[facility] PROGRESS NOTE  [name]:[id number] DOB: 28-Feb-1924 DOA: 11/07/2013 PCP: Josue Hector, MD  Assessment/Plan:  1. Foreign body in the esophagus/metal ring -NPO -EGD today, d/w Dr.Magod -repeat Xray to confirm location -has distal esophageal stricture/narrowing  2. Aspiration pneumonia -clinically not very symptomatic -change to levaquin, has PCN allergy  3. Dementia -daughter reports cognitive decline and confusion off and on for >61months -Fu TSH/B12 -CT head noted -exam non focal, will hold off on MRI   4. Possible Sacral fracture and age indeterminant multiple thoracic compression fractures -pain control, takes vicodin PRN at home -physical therapy consult  5. History of systolic heart failure last EF measured in 2007 was 35-40% - patient is usually on Lasix. Presently on hold.  6. Hypertension - since patient is n.p.o., continue scheduled metoprolol every 8 hourly 2.5 mg and PRN IV hydralazine   7. History of polymyalgia rheumatica - keep on IV Solu-Medrol until patient can swallow oral prednisone.   8. History of CAD -stable  DVT proph: start after EGD today  Code Status: DNR,  discussed with daughter and son Family Communication: discussed with daughter and son at bedside Disposition Plan: to be determined   Consultants:  GI  Antibiotics:  levaquin  HPI/Subjective: Sleepy this am, no complaints  Objective: Filed Vitals:   11/08/13 0500  BP: 127/77  Pulse: 59  Temp: 98.6 F (37 C)  Resp: 12    Intake/Output Summary (Last 24 hours) at 11/08/13 1037 Last data filed at 11/08/13 0700  Gross per 24 hour  Intake      0 ml  Output    100 ml  Net   -100 ml   Filed Weights   11/08/13 0120  Weight: 49.5 kg (109 lb 2 oz)    Exam:   General:  Sleepy, arousible, oriented to self and partly to place, frail, emaciated  Cardiovascular: S1S2/RRR  Respiratory: diminished BS at bases  Abdomen: soft, NT, BS  present  Musculoskeletal: no edema c/c  Data Reviewed: Basic Metabolic Panel:  Recent Labs Lab 11/07/13 2120 11/08/13 0155  NA 132* 132*  K 4.3 3.6*  CL 94* 92*  CO2 26 26  GLUCOSE 128* 98  BUN 19 19  CREATININE 0.98 0.90  CALCIUM 9.2 8.5   Liver Function Tests:  Recent Labs Lab 11/07/13 2120 11/08/13 0155  AST 13 13  ALT 5 5  ALKPHOS 191* 168*  BILITOT 0.6 0.7  PROT 6.9 5.9*  ALBUMIN 3.5 3.1*    Recent Labs Lab 11/07/13 2120  LIPASE 16    Recent Labs Lab 11/08/13 0155  AMMONIA 10*   CBC:  Recent Labs Lab 11/07/13 2120 11/08/13 0155  WBC 11.3* 9.0  NEUTROABS 9.0*  --   HGB 13.9 12.7  HCT 41.3 37.4  MCV 97.2 96.1  PLT 357 324   Cardiac Enzymes:  Recent Labs Lab 11/08/13 0155  CKTOTAL 18  TROPONINI <0.30   BNP (last 3 results) No results found for this basename: PROBNP,  in the last 8760 hours CBG:  Recent Labs Lab 11/08/13 0638  GLUCAP 94    No results found for this or any previous visit (from the past 240 hour(s)).   Studies: Dg Chest 2 View  11/07/2013   CLINICAL DATA:  Fall  EXAM: CHEST  2 VIEW  COMPARISON:  11/25/2010 CT  FINDINGS: A metallic ring projects over the midline at the thoracic inlet. Aortic atherosclerosis. There is cardiac enlargement. Left greater than right pleural effusions. No pneumothorax.  Limited osseous evaluation due to severe osteopenia. There are multilevel compression deformities that are poorly evaluated on the study. See contemporaneous thoracic spine radiograph report.  IMPRESSION: Left greater right pleural effusions.  Cardiomegaly.  A metallic ring projects over the midline at the thoracic inlet and presumed external. Confirm clinically.   Electronically Signed   By: Jearld Lesch M.D.   On: 11/07/2013 22:27   Dg Thoracic Spine 2 View  11/07/2013   CLINICAL DATA:  Fall, back pain  EXAM: THORACIC SPINE - 2 VIEW  COMPARISON:  11/25/2010 chest CT  FINDINGS: See separate lumbar spine radiograph report.  Diffuse osteopenia. Compression fractures at T11, T10, T8, T7, T6. Mild kyphosis. Mild rightward curvature.  IMPRESSION: 50% height loss T7 and T8 compression fractures, not appreciated in 2012. Correlate for point tenderness. Otherwise, compression fractures are similar to prior.   Electronically Signed   By: Jearld Lesch M.D.   On: 11/07/2013 22:40   Dg Lumbar Spine Complete  11/07/2013   CLINICAL DATA:  Fall, low back pain  EXAM: LUMBAR SPINE - COMPLETE 4+ VIEW  COMPARISON:  09/17/2011 CT  FINDINGS: Severe diffuse osteopenia. There are multilevel compression fractures including L2, L1, T11, T10. Similar to prior. Rightward curvature of the lumbar spine. A nondisplaced fracture of the lower sacrum is suspected  IMPRESSION: T10, T11, L1, L2 vertebral body compression fractures are similar to prior.  Nondisplaced lower sacral fracture suspected.   Electronically Signed   By: Jearld Lesch M.D.   On: 11/07/2013 22:36   Dg Pelvis 1-2 Views  11/07/2013   CLINICAL DATA:  Fall  EXAM: PELVIS - 1-2 VIEW  COMPARISON:  09/17/2011 CT  FINDINGS: Osteopenia. No displaced pelvic fracture or dislocation identified. Sacrum obscured by overlying bowel.  IMPRESSION: Sacrum obscured by overlying bowel and cannot be evaluated. Otherwise, no displaced fracture of the pelvis identified.   Electronically Signed   By: Jearld Lesch M.D.   On: 11/07/2013 22:32   Ct Head Wo Contrast  11/07/2013   CLINICAL DATA:  Altered mental status, recent fall  EXAM: CT HEAD WITHOUT CONTRAST  CT CERVICAL SPINE WITHOUT CONTRAST  TECHNIQUE: Multidetector CT imaging of the head and cervical spine was performed following the standard protocol without intravenous contrast. Multiplanar CT image reconstructions of the cervical spine were also generated.  COMPARISON:  09/20/2006  FINDINGS: CT HEAD FINDINGS  Prominence of the sulci, cisterns, and ventricles, in keeping with volume loss. No overt hydrocephalus. No definite CT evidence of an  acute infarction. Periventricular and subcortical white matter hypodensities, a nonspecific finding often seen in the setting of chronic microangiopathic change. No intraparenchymal hemorrhage, mass, mass effect, or abnormal extra-axial fluid collection. Atherosclerotic vascular calcifications. Visualized paranasal sinuses and mastoid air cells are predominantly clear. Lens replacements. No displaced calvarial fracture.  CT CERVICAL SPINE FINDINGS  Diffuse osteopenia. Maintained craniocervical relationship. No dens fracture. Minimal retrolisthesis of C5 on C6. Disc osteophyte complexes at C4-5, C5-6, and C6-7 result in mild-to-moderate central canal narrowing. There is a 1.8 cm metallic ring within the upper esophagus, resulting in significant streak artifact at the level of the C7 vertebral body. Mild apical scarring.  IMPRESSION: Volume loss and white matter changes as above. No definite CT evidence of an acute intracranial abnormality  Mild multilevel degenerative changes. No acute osseous finding of the cervical spine.  Metallic ring within the upper esophagus may reflect an ingested foreign body. Correlate clinically.   Electronically Signed   By: Lerry Liner.D.  On: 11/07/2013 22:59   Ct Cervical Spine Wo Contrast  11/07/2013   CLINICAL DATA:  Altered mental status, recent fall  EXAM: CT HEAD WITHOUT CONTRAST  CT CERVICAL SPINE WITHOUT CONTRAST  TECHNIQUE: Multidetector CT imaging of the head and cervical spine was performed following the standard protocol without intravenous contrast. Multiplanar CT image reconstructions of the cervical spine were also generated.  COMPARISON:  09/20/2006  FINDINGS: CT HEAD FINDINGS  Prominence of the sulci, cisterns, and ventricles, in keeping with volume loss. No overt hydrocephalus. No definite CT evidence of an acute infarction. Periventricular and subcortical white matter hypodensities, a nonspecific finding often seen in the setting of chronic microangiopathic  change. No intraparenchymal hemorrhage, mass, mass effect, or abnormal extra-axial fluid collection. Atherosclerotic vascular calcifications. Visualized paranasal sinuses and mastoid air cells are predominantly clear. Lens replacements. No displaced calvarial fracture.  CT CERVICAL SPINE FINDINGS  Diffuse osteopenia. Maintained craniocervical relationship. No dens fracture. Minimal retrolisthesis of C5 on C6. Disc osteophyte complexes at C4-5, C5-6, and C6-7 result in mild-to-moderate central canal narrowing. There is a 1.8 cm metallic ring within the upper esophagus, resulting in significant streak artifact at the level of the C7 vertebral body. Mild apical scarring.  IMPRESSION: Volume loss and white matter changes as above. No definite CT evidence of an acute intracranial abnormality  Mild multilevel degenerative changes. No acute osseous finding of the cervical spine.  Metallic ring within the upper esophagus may reflect an ingested foreign body. Correlate clinically.   Electronically Signed   By: Jearld LeschAndrew  DelGaizo M.D.   On: 11/07/2013 22:59   Ct Abdomen Pelvis W Contrast  11/07/2013   CLINICAL DATA:  Fall, diffuse abdominal pain  EXAM: CT ABDOMEN AND PELVIS WITH CONTRAST  TECHNIQUE: Multidetector CT imaging of the abdomen and pelvis was performed using the standard protocol following bolus administration of intravenous contrast.  CONTRAST:  80mL OMNIPAQUE IOHEXOL 300 MG/ML  SOLN  COMPARISON:  09/17/2011  FINDINGS: Degraded by respiratory motion. Further degraded by extremity positioning.  Cardiac enlargement. Small pericardial effusion. Coronary artery calcifications. Leftward shift of the heart. Left lower lobe linear opacity, most in keeping with atelectasis or scarring.  Nonspecific hypodensities within the liver are similar to prior with the exception of a previously noted hepatic dome cyst which is not seen today. No appreciable abnormality of the spleen, pancreas, biliary system, adrenal glands.  Symmetric renal enhancement. No hydroureteronephrosis.  Large stool burden. No overt colitis. No bowel obstruction. Decompressed stomach with nonspecific mild wall thickening, similar to prior. No free intraperitoneal air. Small amount of free fluid dependently within the pelvis.  Thin walled bladder.  Uterus not seen.  No adnexal mass.  Scattered atherosclerosis of the aorta and branch vessels without aneurysmal dilatation.  Diffuse osteopenia. A nondisplaced lower sacral fracture is again suspected on series 2, image 54. The L5 transverse process articulates with the iliac bone on the left, similar to prior. L2, L1, T11, T10 compression fractures are similar to prior.  IMPRESSION: Degraded by motion.  A nondisplaced lower sacral fracture is suspected. Correlate clinically.  Lower thoracic and lumbar compression fractures are similar prior.  Left lower lobe opacity may reflect atelectasis, pneumonia, or aspiration.  Cardiomegaly.   Electronically Signed   By: Jearld LeschAndrew  DelGaizo M.D.   On: 11/07/2013 23:18    Scheduled Meds: . levofloxacin (LEVAQUIN) IV  500 mg Intravenous Q24H  . methylPREDNISolone (SOLU-MEDROL) injection  20 mg Intravenous Daily  . metoprolol  2.5 mg Intravenous Q8H  . pantoprazole (PROTONIX)  IV  40 mg Intravenous QHS   Continuous Infusions: . sodium chloride 1,000 mL (11/08/13 0152)    Active Problems:   CAD, NATIVE VESSEL   SYSTOLIC HEART FAILURE, CHRONIC   Sacral fracture   Compression fracture   Foreign body in esophagus   Aspiration pneumonia   Polymyalgia rheumatica   Acute encephalopathy    Time spent:    [facility]  [facility] Pager [phone]. If 7PM-7AM, please contact night-coverage at [email or web address], password [facility] 11/08/2013, 10:37 AM  LOS: 1 day

## 2013-11-08 NOTE — ED Notes (Signed)
MD at bedside. 

## 2013-11-08 NOTE — Op Note (Signed)
[facility] [address] [place], [id number]   ENDOSCOPY PROCEDURE REPORT  PATIENT: [name], Amber S.  MR#: [id number] BIRTHDATE: 1924-03-19 , 89  yrs. old GENDER: Female  ENDOSCOPIST: Vida RiggerMarc Stirling Orton, MD REFERRED BY:  PROCEDURE DATE:  11/08/2013 PROCEDURE:   EGD w/ fb removal ASA CLASS:   Class IV INDICATIONS:Foreign body removal from esophagus.  ring on chest Xray MEDICATIONS: Fentanyl 12.5 mcg IV and Versed 1mg  IV  TOPICAL ANESTHETIC:none  DESCRIPTION OF PROCEDURE:   After the risks benefits and alternatives of the procedure were thoroughly explained, informed consent was obtained.  The Pentax Gastroscope [id number]  endoscope was introduced through the mouth and advanced to the stomach body , limited by Without limitations.   The instrument was slowly withdrawn as the mucosa was fully examined.we initially did not see the ring as we advanced into the stomach but on slow withdrawal the distal and midesophagus was normal however in the most proximal part of the esophagus was the ring which we grabbed with the rat tooth forceps and removed along with the scope and the ring was recovered and the patient tolerated the procedure well there was no obvious immediate complication         FINDINGS:ring in  proximal esophagus status post removal using rat-tooth forceps  2 otherwise within normal limits to the proximal stomach  COMPLICATIONS:none  ENDOSCOPIC IMPRESSION:above   RECOMMENDATIONS:per medical team please call if I can be of any further assistance with this hospital stay and will leave diet to the medical team   REPEAT EXAM: as needed   _______________________________ Vida RiggerMarc Emree Locicero, MD eSigned:  Vida RiggerMarc Shadi Sessler, MD 11/08/2013 12:12 PM    CC:

## 2013-11-08 NOTE — Progress Notes (Signed)
INITIAL NUTRITION ASSESSMENT  DOCUMENTATION CODES Per approved criteria  -Not Applicable   INTERVENTION: - Diet advancement per MD - Will continue to monitor   NUTRITION DIAGNOSIS: Inadequate oral intake related to clear liquid diet as evidenced by diet order.   Goal: Advance diet as tolerated to regular diet  Monitor:  Weights, labs, diet advancement  Reason for Assessment: Malnutrition screening tool   80 y.o. female  Admitting Dx: Abdominal pain and nausea   ASSESSMENT: Pt with history of CAD, chronic systolic heart failure last EF was 35-40%, rheumatic on prednisone, hypertension was brought to the ER after patient was having nausea and abdominal pain. In addition patient was found confused and patient's daughter who provided most of the history states that patient has been confused over the last week or so. Patient had a fall 2 days ago when she slipped and fell. She fell onto her back but did not hit her head and did not lose consciousness. Patient otherwise did not have any chest pain or shortness of breath. In the ER patient had extensive studies including CT head, neck on CT abdomen pelvis chest x-ray. Studies showed that patient has a metallic ring in her esophagus. Patient otherwise was found to have possible aspiration pneumonia and age indeterminant fractures of the thoracic spine and has sacral fracture.   Pt out of room for EGD. Called pt's daughter to obtain dietary history, however got no answer. Per ED notes, pt has not had BM in 1 week.   Potassium slightly low Alk phos elevated   Height: Ht Readings from Last 1 Encounters:  11/08/13 5' 1"  (1.549 m)    Weight: Wt Readings from Last 1 Encounters:  11/08/13 109 lb 2 oz (49.5 kg)    Ideal Body Weight: 105 lb   % Ideal Body Weight: 104%  Wt Readings from Last 10 Encounters:  11/08/13 109 lb 2 oz (49.5 kg)  11/08/13 109 lb 2 oz (49.5 kg)  01/31/12 121 lb (54.885 kg)  01/11/11 129 lb (58.514 kg)   12/14/10 119 lb 4 oz (54.091 kg)  12/08/10 120 lb (54.432 kg)  11/27/10 118 lb (53.524 kg)  03/26/10 133 lb (60.328 kg)    Usual Body Weight: No recent weights documented   BMI:  Body mass index is 20.63 kg/(m^2).  Estimated Nutritional Needs: Kcal: 1250-1450 Protein: 60-70g Fluid: 1.2-1.4L/day   Skin: Intact   Diet Order: NPO  EDUCATION NEEDS: -No education needs identified at this time   Intake/Output Summary (Last 24 hours) at 11/08/13 1340 Last data filed at 11/08/13 0700  Gross per 24 hour  Intake      0 ml  Output    100 ml  Net   -100 ml    Last BM: PTA  Labs:   Recent Labs Lab 11/07/13 2120 11/08/13 0155  NA 132* 132*  K 4.3 3.6*  CL 94* 92*  CO2 26 26  BUN 19 19  CREATININE 0.98 0.90  CALCIUM 9.2 8.5  GLUCOSE 128* 98    CBG (last 3)   Recent Labs  11/08/13 0638  GLUCAP 94    Scheduled Meds: . levofloxacin (LEVAQUIN) IV  750 mg Intravenous Q48H  . methylPREDNISolone (SOLU-MEDROL) injection  20 mg Intravenous Daily  . metoprolol  2.5 mg Intravenous Q8H  . pantoprazole (PROTONIX) IV  40 mg Intravenous QHS    Continuous Infusions: . sodium chloride 1,000 mL (11/08/13 0152)  . sodium chloride 500 mL (11/08/13 1145)    Past Medical History  Diagnosis Date  . CAD (coronary artery disease)     s/p MI in 2005 -- has bare metal stent in the R coronary artery  . Systolic dysfunction   . Dyspnea on exertion   . Orthostatic hypotension   . HLD (hyperlipidemia)   . Moderate mitral regurgitation by prior echocardiogram   . Hypertension     Past Surgical History  Procedure Laterality Date  . Colonoscopy w/ polypectomy    . Ectropion repair      left lower eyelid  . Cardiac catheterization      Mikey College MS, [place], [place] Pager [phone] After Hours Pager

## 2013-11-08 NOTE — Consult Note (Signed)
Reason for Consult: Ring in the esophagus on x-ray Referring Physician: Hospital team  [name] is an 80 y.o. female.  HPI: Patient known to my partner Dr. Amedeo Plenty with multiple GI issues over the years and her office computer chart was briefly reviewed including last endoscopy in 2011 and recent barium swallow last year and she was admitted with alternating status and x-rays showed a ring in her esophagus and we are asked to remove it and I discussed the case with the hospital team who discussed the case with her daughter who has agreed to proceed  Past Medical History  Diagnosis Date  . CAD (coronary artery disease)     s/p MI in 2005 -- has bare metal stent in the R coronary artery  . Systolic dysfunction   . Dyspnea on exertion   . Orthostatic hypotension   . HLD (hyperlipidemia)   . Moderate mitral regurgitation by prior echocardiogram   . Hypertension     Past Surgical History  Procedure Laterality Date  . Colonoscopy w/ polypectomy    . Ectropion repair      left lower eyelid  . Cardiac catheterization      Family History  Problem Relation Age of Onset  . Appendicitis Mother   . Cancer Father   . Lung cancer Brother   . Stroke Sister   . COPD Sister     Social History:  reports that she has never smoked. She does not have any smokeless tobacco history on file. She reports that she does not drink alcohol or use illicit drugs.  Allergies:  Allergies  Allergen Reactions  . Meperidine Hcl Swelling    Swelling of the neck  . Penicillins Rash    Medications: I have reviewed the patient's current medications.  Results for orders placed during the hospital encounter of 11/07/13 (from the past 48 hour(s))  CBC WITH DIFFERENTIAL     Status: Abnormal   Collection Time    11/07/13  9:20 PM      Result Value Range   WBC 11.3 (*) 4.0 - 10.5 K/uL   RBC 4.25  3.87 - 5.11 MIL/uL   Hemoglobin 13.9  12.0 - 15.0 g/dL   HCT 41.3  36.0 - 46.0 %   MCV 97.2  78.0 - 100.0 fL    MCH 32.7  26.0 - 34.0 pg   MCHC 33.7  30.0 - 36.0 g/dL   RDW 13.4  11.5 - 15.5 %   Platelets 357  150 - 400 K/uL   Neutrophils Relative % 79 (*) 43 - 77 %   Neutro Abs 9.0 (*) 1.7 - 7.7 K/uL   Lymphocytes Relative 14  12 - 46 %   Lymphs Abs 1.6  0.7 - 4.0 K/uL   Monocytes Relative 6  3 - 12 %   Monocytes Absolute 0.7  0.1 - 1.0 K/uL   Eosinophils Relative 0  0 - 5 %   Eosinophils Absolute 0.0  0.0 - 0.7 K/uL   Basophils Relative 0  0 - 1 %   Basophils Absolute 0.0  0.0 - 0.1 K/uL  COMPREHENSIVE METABOLIC PANEL     Status: Abnormal   Collection Time    11/07/13  9:20 PM      Result Value Range   Sodium 132 (*) 137 - 147 mEq/L   Potassium 4.3  3.7 - 5.3 mEq/L   Chloride 94 (*) 96 - 112 mEq/L   CO2 26  19 - 32 mEq/L  Glucose, Bld 128 (*) 70 - 99 mg/dL   BUN 19  6 - 23 mg/dL   Creatinine, Ser 0.98  0.50 - 1.10 mg/dL   Calcium 9.2  8.4 - 10.5 mg/dL   Total Protein 6.9  6.0 - 8.3 g/dL   Albumin 3.5  3.5 - 5.2 g/dL   AST 13  0 - 37 U/L   ALT 5  0 - 35 U/L   Alkaline Phosphatase 191 (*) 39 - 117 U/L   Total Bilirubin 0.6  0.3 - 1.2 mg/dL   GFR calc non Af Amer 50 (*) >90 mL/min   GFR calc Af Amer 58 (*) >90 mL/min   Comment: (NOTE)     The eGFR has been calculated using the CKD EPI equation.     This calculation has not been validated in all clinical situations.     eGFR's persistently <90 mL/min signify possible Chronic Kidney     Disease.  LIPASE, BLOOD     Status: None   Collection Time    11/07/13  9:20 PM      Result Value Range   Lipase 16  11 - 59 U/L  URINALYSIS W MICROSCOPIC + REFLEX CULTURE     Status: Abnormal   Collection Time    11/07/13 10:27 PM      Result Value Range   Color, Urine YELLOW  YELLOW   APPearance CLEAR  CLEAR   Specific Gravity, Urine 1.027  1.005 - 1.030   pH 7.0  5.0 - 8.0   Glucose, UA NEGATIVE  NEGATIVE mg/dL   Hgb urine dipstick NEGATIVE  NEGATIVE   Bilirubin Urine NEGATIVE  NEGATIVE   Ketones, ur NEGATIVE  NEGATIVE mg/dL    Protein, ur NEGATIVE  NEGATIVE mg/dL   Urobilinogen, UA 1.0  0.0 - 1.0 mg/dL   Nitrite NEGATIVE  NEGATIVE   Leukocytes, UA NEGATIVE  NEGATIVE   WBC, UA 0-2  <3 WBC/hpf   RBC / HPF 0-2  <3 RBC/hpf   Casts HYALINE CASTS (*) NEGATIVE   Crystals CA OXALATE CRYSTALS (*) NEGATIVE  COMPREHENSIVE METABOLIC PANEL     Status: Abnormal   Collection Time    11/08/13  1:55 AM      Result Value Range   Sodium 132 (*) 137 - 147 mEq/L   Potassium 3.6 (*) 3.7 - 5.3 mEq/L   Chloride 92 (*) 96 - 112 mEq/L   CO2 26  19 - 32 mEq/L   Glucose, Bld 98  70 - 99 mg/dL   BUN 19  6 - 23 mg/dL   Creatinine, Ser 0.90  0.50 - 1.10 mg/dL   Calcium 8.5  8.4 - 10.5 mg/dL   Total Protein 5.9 (*) 6.0 - 8.3 g/dL   Albumin 3.1 (*) 3.5 - 5.2 g/dL   AST 13  0 - 37 U/L   ALT 5  0 - 35 U/L   Alkaline Phosphatase 168 (*) 39 - 117 U/L   Total Bilirubin 0.7  0.3 - 1.2 mg/dL   GFR calc non Af Amer 56 (*) >90 mL/min   GFR calc Af Amer 65 (*) >90 mL/min   Comment: (NOTE)     The eGFR has been calculated using the CKD EPI equation.     This calculation has not been validated in all clinical situations.     eGFR's persistently <90 mL/min signify possible Chronic Kidney     Disease.  CBC     Status: None   Collection Time  11/08/13  1:55 AM      Result Value Range   WBC 9.0  4.0 - 10.5 K/uL   RBC 3.89  3.87 - 5.11 MIL/uL   Hemoglobin 12.7  12.0 - 15.0 g/dL   HCT 37.4  36.0 - 46.0 %   MCV 96.1  78.0 - 100.0 fL   MCH 32.6  26.0 - 34.0 pg   MCHC 34.0  30.0 - 36.0 g/dL   RDW 13.5  11.5 - 15.5 %   Platelets 324  150 - 400 K/uL  TSH     Status: None   Collection Time    11/08/13  1:55 AM      Result Value Range   TSH 1.697  0.350 - 4.500 uIU/mL   Comment: Performed at [facility]  CK     Status: None   Collection Time    11/08/13  1:55 AM      Result Value Range   Total CK 18  7 - 177 U/L  TROPONIN I     Status: None   Collection Time    11/08/13  1:55 AM      Result Value Range   Troponin I <0.30   <0.30 ng/mL   Comment:            Due to the release kinetics of cTnI,     a negative result within the first hours     of the onset of symptoms does not rule out     myocardial infarction with certainty.     If myocardial infarction is still suspected,     repeat the test at appropriate intervals.  AMMONIA     Status: Abnormal   Collection Time    11/08/13  1:55 AM      Result Value Range   Ammonia 10 (*) 11 - 60 umol/L  GLUCOSE, CAPILLARY     Status: None   Collection Time    11/08/13  6:38 AM      Result Value Range   Glucose-Capillary 94  70 - 99 mg/dL  URINE RAPID DRUG SCREEN (HOSP PERFORMED)     Status: Abnormal   Collection Time    11/08/13  7:27 AM      Result Value Range   Opiates POSITIVE (*) NONE DETECTED   Cocaine NONE DETECTED  NONE DETECTED   Benzodiazepines NONE DETECTED  NONE DETECTED   Amphetamines NONE DETECTED  NONE DETECTED   Tetrahydrocannabinol NONE DETECTED  NONE DETECTED   Barbiturates NONE DETECTED  NONE DETECTED   Comment:            DRUG SCREEN FOR MEDICAL PURPOSES     ONLY.  IF CONFIRMATION IS NEEDED     FOR ANY PURPOSE, NOTIFY LAB     WITHIN 5 DAYS.                LOWEST DETECTABLE LIMITS     FOR URINE DRUG SCREEN     Drug Class       Cutoff (ng/mL)     Amphetamine      1000     Barbiturate      200     Benzodiazepine   948     Tricyclics       016     Opiates          300     Cocaine          300     THC  50    Dg Chest 2 View  11/07/2013   CLINICAL DATA:  Fall  EXAM: CHEST  2 VIEW  COMPARISON:  11/25/2010 CT  FINDINGS: A metallic ring projects over the midline at the thoracic inlet. Aortic atherosclerosis. There is cardiac enlargement. Left greater than right pleural effusions. No pneumothorax. Limited osseous evaluation due to severe osteopenia. There are multilevel compression deformities that are poorly evaluated on the study. See contemporaneous thoracic spine radiograph report.  IMPRESSION: Left greater right pleural  effusions.  Cardiomegaly.  A metallic ring projects over the midline at the thoracic inlet and presumed external. Confirm clinically.   Electronically Signed   By: Carlos Levering M.D.   On: 11/07/2013 22:27   Dg Thoracic Spine 2 View  11/07/2013   CLINICAL DATA:  Fall, back pain  EXAM: THORACIC SPINE - 2 VIEW  COMPARISON:  11/25/2010 chest CT  FINDINGS: See separate lumbar spine radiograph report. Diffuse osteopenia. Compression fractures at T11, T10, T8, T7, T6. Mild kyphosis. Mild rightward curvature.  IMPRESSION: 50% height loss T7 and T8 compression fractures, not appreciated in 2012. Correlate for point tenderness. Otherwise, compression fractures are similar to prior.   Electronically Signed   By: Carlos Levering M.D.   On: 11/07/2013 22:40   Dg Lumbar Spine Complete  11/07/2013   CLINICAL DATA:  Fall, low back pain  EXAM: LUMBAR SPINE - COMPLETE 4+ VIEW  COMPARISON:  09/17/2011 CT  FINDINGS: Severe diffuse osteopenia. There are multilevel compression fractures including L2, L1, T11, T10. Similar to prior. Rightward curvature of the lumbar spine. A nondisplaced fracture of the lower sacrum is suspected  IMPRESSION: T10, T11, L1, L2 vertebral body compression fractures are similar to prior.  Nondisplaced lower sacral fracture suspected.   Electronically Signed   By: Carlos Levering M.D.   On: 11/07/2013 22:36   Dg Pelvis 1-2 Views  11/07/2013   CLINICAL DATA:  Fall  EXAM: PELVIS - 1-2 VIEW  COMPARISON:  09/17/2011 CT  FINDINGS: Osteopenia. No displaced pelvic fracture or dislocation identified. Sacrum obscured by overlying bowel.  IMPRESSION: Sacrum obscured by overlying bowel and cannot be evaluated. Otherwise, no displaced fracture of the pelvis identified.   Electronically Signed   By: Carlos Levering M.D.   On: 11/07/2013 22:32   Ct Head Wo Contrast  11/07/2013   CLINICAL DATA:  Altered mental status, recent fall  EXAM: CT HEAD WITHOUT CONTRAST  CT CERVICAL SPINE WITHOUT CONTRAST   TECHNIQUE: Multidetector CT imaging of the head and cervical spine was performed following the standard protocol without intravenous contrast. Multiplanar CT image reconstructions of the cervical spine were also generated.  COMPARISON:  09/20/2006  FINDINGS: CT HEAD FINDINGS  Prominence of the sulci, cisterns, and ventricles, in keeping with volume loss. No overt hydrocephalus. No definite CT evidence of an acute infarction. Periventricular and subcortical white matter hypodensities, a nonspecific finding often seen in the setting of chronic microangiopathic change. No intraparenchymal hemorrhage, mass, mass effect, or abnormal extra-axial fluid collection. Atherosclerotic vascular calcifications. Visualized paranasal sinuses and mastoid air cells are predominantly clear. Lens replacements. No displaced calvarial fracture.  CT CERVICAL SPINE FINDINGS  Diffuse osteopenia. Maintained craniocervical relationship. No dens fracture. Minimal retrolisthesis of C5 on C6. Disc osteophyte complexes at C4-5, C5-6, and C6-7 result in mild-to-moderate central canal narrowing. There is a 1.8 cm metallic ring within the upper esophagus, resulting in significant streak artifact at the level of the C7 vertebral body. Mild apical scarring.  IMPRESSION: Volume loss and white  matter changes as above. No definite CT evidence of an acute intracranial abnormality  Mild multilevel degenerative changes. No acute osseous finding of the cervical spine.  Metallic ring within the upper esophagus may reflect an ingested foreign body. Correlate clinically.   Electronically Signed   By: Carlos Levering M.D.   On: 11/07/2013 22:59   Ct Cervical Spine Wo Contrast  11/07/2013   CLINICAL DATA:  Altered mental status, recent fall  EXAM: CT HEAD WITHOUT CONTRAST  CT CERVICAL SPINE WITHOUT CONTRAST  TECHNIQUE: Multidetector CT imaging of the head and cervical spine was performed following the standard protocol without intravenous contrast.  Multiplanar CT image reconstructions of the cervical spine were also generated.  COMPARISON:  09/20/2006  FINDINGS: CT HEAD FINDINGS  Prominence of the sulci, cisterns, and ventricles, in keeping with volume loss. No overt hydrocephalus. No definite CT evidence of an acute infarction. Periventricular and subcortical white matter hypodensities, a nonspecific finding often seen in the setting of chronic microangiopathic change. No intraparenchymal hemorrhage, mass, mass effect, or abnormal extra-axial fluid collection. Atherosclerotic vascular calcifications. Visualized paranasal sinuses and mastoid air cells are predominantly clear. Lens replacements. No displaced calvarial fracture.  CT CERVICAL SPINE FINDINGS  Diffuse osteopenia. Maintained craniocervical relationship. No dens fracture. Minimal retrolisthesis of C5 on C6. Disc osteophyte complexes at C4-5, C5-6, and C6-7 result in mild-to-moderate central canal narrowing. There is a 1.8 cm metallic ring within the upper esophagus, resulting in significant streak artifact at the level of the C7 vertebral body. Mild apical scarring.  IMPRESSION: Volume loss and white matter changes as above. No definite CT evidence of an acute intracranial abnormality  Mild multilevel degenerative changes. No acute osseous finding of the cervical spine.  Metallic ring within the upper esophagus may reflect an ingested foreign body. Correlate clinically.   Electronically Signed   By: Carlos Levering M.D.   On: 11/07/2013 22:59   Ct Abdomen Pelvis W Contrast  11/07/2013   CLINICAL DATA:  Fall, diffuse abdominal pain  EXAM: CT ABDOMEN AND PELVIS WITH CONTRAST  TECHNIQUE: Multidetector CT imaging of the abdomen and pelvis was performed using the standard protocol following bolus administration of intravenous contrast.  CONTRAST:  79m OMNIPAQUE IOHEXOL 300 MG/ML  SOLN  COMPARISON:  09/17/2011  FINDINGS: Degraded by respiratory motion. Further degraded by extremity positioning.   Cardiac enlargement. Small pericardial effusion. Coronary artery calcifications. Leftward shift of the heart. Left lower lobe linear opacity, most in keeping with atelectasis or scarring.  Nonspecific hypodensities within the liver are similar to prior with the exception of a previously noted hepatic dome cyst which is not seen today. No appreciable abnormality of the spleen, pancreas, biliary system, adrenal glands. Symmetric renal enhancement. No hydroureteronephrosis.  Large stool burden. No overt colitis. No bowel obstruction. Decompressed stomach with nonspecific mild wall thickening, similar to prior. No free intraperitoneal air. Small amount of free fluid dependently within the pelvis.  Thin walled bladder.  Uterus not seen.  No adnexal mass.  Scattered atherosclerosis of the aorta and branch vessels without aneurysmal dilatation.  Diffuse osteopenia. A nondisplaced lower sacral fracture is again suspected on series 2, image 54. The L5 transverse process articulates with the iliac bone on the left, similar to prior. L2, L1, T11, T10 compression fractures are similar to prior.  IMPRESSION: Degraded by motion.  A nondisplaced lower sacral fracture is suspected. Correlate clinically.  Lower thoracic and lumbar compression fractures are similar prior.  Left lower lobe opacity may reflect atelectasis, pneumonia, or aspiration.  Cardiomegaly.   Electronically Signed   By: Carlos Levering M.D.   On: 11/07/2013 23:18   Dg Chest Port 1 View  11/08/2013   CLINICAL DATA:  Reassess positioning of metallic ring in the proximal esophagus.  EXAM: PORTABLE CHEST - 1 VIEW  COMPARISON:  DG CHEST 2 VIEW dated 11/07/2013  FINDINGS: The position of the metallic ring in the proximal esophagus appears unchanged. It lies at the level of the thoracic inlet. The lungs are hypoinflated. There is stable density at the left lung base. The cardiac silhouette remains enlarged.  IMPRESSION: There has not been significant change in the  position of the ring-like foreign body in the proximal esophagus.   Electronically Signed   By: David  Martinique   On: 11/08/2013 11:29    ROS unable to obtain do to alter metal status Blood pressure 146/71, pulse 69, temperature 97.7 F (36.5 C), temperature source Oral, resp. rate 23, height 5' 1"  (1.549 m), weight 49.5 kg (109 lb 2 oz), SpO2 98.00%. Physical Exam vital signs stable afebrile exam please see pre-assessment evaluation  Assessment/Plan: Multiple medical problems in patient with asymptomatic ring in the esophagus Plan: We'll proceed with endoscopy and try to remove the ring with further workup and plans please see that procedural dictation  [facility] E 11/08/2013, 11:56 AM

## 2013-11-08 NOTE — Evaluation (Signed)
Physical Therapy Evaluation Patient Details Name: [name] MRN: [id number] DOB: [birth year] Today's Date: 11/08/2013 Time: 4098-1191 PT Time Calculation (min): 23 min  PT Assessment / Plan / Recommendation History of Present Illness  80 yo female admitted with AMS, Pna, non-displaced sacral fx (occurred ~2 weeks ago per family). Hx of T10, T11, L1,L2 comp fx, CAD, HF, HTN, polymyalgia rheumatica. Pt lives with family  Clinical Impression  On eval, pt required Mod assist for mobility-able to ambulate ~10 feet in room with walker. Demonstrates general weakness, decreased activity tolerance, and impaired gait and balance. Recommend HHPT, 24/7 care. Family present and state they plan for pt to return home with them.     PT Assessment  Patient needs continued PT services    Follow Up Recommendations  Home health PT;Supervision/Assistance - 24 hour    Does the patient have the potential to tolerate intense rehabilitation      Barriers to Discharge        Equipment Recommendations  Wheelchair ;Wheelchair cushion ;3in1  (if family would like to have )    Recommendations for Other Services OT consult   Frequency Min 3X/week    Precautions / Restrictions Precautions Precautions: Fall Restrictions Weight Bearing Restrictions: No   Pertinent Vitals/Pain Back-unrated "not that bad"      Mobility  Bed Mobility Overal bed mobility: Needs Assistance Bed Mobility: Supine to Sit;Sit to Supine Supine to sit: Min assist;HOB elevated Sit to supine: Min assist;HOB elevated General bed mobility comments: Increased time. Assist for LEs.  Transfers Overall transfer level: Needs assistance Equipment used: Rolling walker (2 wheeled) Transfers: Sit to/from Stand Sit to Stand: From elevated surface;Mod assist General transfer comment: Assist to rise, stabilize, control descent. Multimodal cues for safety, technique, hand placement.  Ambulation/Gait Ambulation/Gait assistance: Min  assist Ambulation Distance (Feet): 10 Feet Assistive device: Rolling walker (2 wheeled) Gait Pattern/deviations: Step-to pattern;Trunk flexed;Decreased stride length;Decreased step length - left;Decreased step length - right General Gait Details: very slow gait speed. fatigues easily. assist to stabilize/support pt throughout ambulation and to maneuver with walker.     Exercises     PT Diagnosis: Difficulty walking;Abnormality of gait;Generalized weakness  PT Problem List: Decreased strength;Decreased activity tolerance;Decreased balance;Decreased mobility;Decreased knowledge of use of DME;Pain PT Treatment Interventions: DME instruction;Gait training;Functional mobility training;Therapeutic activities;Therapeutic exercise;Patient/family education;Balance training     PT Goals(Current goals can be found in the care plan section) Acute Rehab PT Goals Patient Stated Goal: none stated PT Goal Formulation: With patient/family Time For Goal Achievement: 11/22/13 Potential to Achieve Goals: Good  Visit Information  Last PT Received On: 11/08/13 Assistance Needed: +1 History of Present Illness: 80 yo female admitted with AMS, Pna, non-displaced sacral fx (occurred ~2 weeks ago per family). Hx of T10, T11, L1,L2 comp fx, CAD, HF, HTN, polymyalgia rheumatica. Pt lives with family       Prior Functioning  Home Living Family/patient expects to be discharged to:: Private residence Living Arrangements: Children Available Help at Discharge: Family Type of Home: House Home Layout: One level Home Equipment: Environmental consultant - 4 wheels Prior Function Level of Independence: Needs assistance Communication Communication: No difficulties    Cognition  Cognition Arousal/Alertness: Awake/alert Behavior During Therapy: WFL for tasks assessed/performed Overall Cognitive Status: Within Functional Limits for tasks assessed    Extremity/Trunk Assessment Upper Extremity Assessment Upper Extremity Assessment:  Generalized weakness Lower Extremity Assessment Lower Extremity Assessment: Generalized weakness Cervical / Trunk Assessment Cervical / Trunk Assessment: Kyphotic   Balance Balance Overall balance assessment: History  of Falls;Needs assistance Sitting-balance support: Bilateral upper extremity supported;Feet supported Sitting balance-Leahy Scale: Fair Standing balance support: Bilateral upper extremity supported;During functional activity Standing balance-Leahy Scale: Poor  End of Session PT - End of Session Equipment Utilized During Treatment: Gait belt Activity Tolerance: Patient limited by fatigue Patient left: in bed;with call bell/phone within reach;with family/visitor present;with bed alarm set Nurse Communication: Mobility status  GP     Rebeca AlertJannie Jawuan Robb, MPT Pager: [phone]

## 2013-11-09 ENCOUNTER — Other Ambulatory Visit: Payer: Medicare Other

## 2013-11-09 ENCOUNTER — Encounter (HOSPITAL_COMMUNITY): Payer: Self-pay | Admitting: Gastroenterology

## 2013-11-09 DIAGNOSIS — R109 Unspecified abdominal pain: Secondary | ICD-10-CM

## 2013-11-09 MED ORDER — PREDNISONE 5 MG PO TABS
5.0000 mg | ORAL_TABLET | Freq: Every day | ORAL | Status: DC
  Administered 2013-11-09 – 2013-11-10 (×2): 5 mg via ORAL
  Filled 2013-11-09 (×3): qty 1

## 2013-11-09 MED ORDER — ENSURE COMPLETE PO LIQD
237.0000 mL | Freq: Three times a day (TID) | ORAL | Status: DC
  Administered 2013-11-09 – 2013-11-10 (×2): 237 mL via ORAL

## 2013-11-09 MED ORDER — POLYETHYLENE GLYCOL 3350 17 G PO PACK
17.0000 g | PACK | Freq: Every day | ORAL | Status: DC
  Administered 2013-11-09 – 2013-11-10 (×2): 17 g via ORAL
  Filled 2013-11-09 (×2): qty 1

## 2013-11-09 MED ORDER — LEVOFLOXACIN 500 MG PO TABS
500.0000 mg | ORAL_TABLET | ORAL | Status: DC
Start: 2013-11-10 — End: 2013-11-10
  Filled 2013-11-09: qty 1

## 2013-11-09 MED ORDER — PANTOPRAZOLE SODIUM 40 MG PO TBEC
40.0000 mg | DELAYED_RELEASE_TABLET | Freq: Every day | ORAL | Status: DC
  Administered 2013-11-09: 40 mg via ORAL
  Filled 2013-11-09 (×2): qty 1

## 2013-11-09 MED ORDER — SENNOSIDES-DOCUSATE SODIUM 8.6-50 MG PO TABS
1.0000 | ORAL_TABLET | Freq: Two times a day (BID) | ORAL | Status: DC
  Administered 2013-11-09 – 2013-11-10 (×3): 1 via ORAL
  Filled 2013-11-09 (×4): qty 1

## 2013-11-09 MED ORDER — CYANOCOBALAMIN 1000 MCG/ML IJ SOLN
1000.0000 ug | Freq: Once | INTRAMUSCULAR | Status: AC
  Administered 2013-11-09: 1000 ug via SUBCUTANEOUS
  Filled 2013-11-09: qty 1

## 2013-11-09 NOTE — Progress Notes (Signed)
NUTRITION FOLLOW UP  Intervention:   - Ensure Complete TID - Encouraged high calorie/protein diet foods/beverages during admission and after d/c to prevent any further unintended weight loss - Will continue to monitor   Nutrition Dx:   Inadequate oral intake related to clear liquid diet as evidenced by diet order - ongoing but related to poor appetite/dementia as evidenced by <25% meal intake.    Goal:   Diet advancement - met  New goal: Pt to consume >90% of meals/supplements   Monitor:   Weights, labs, intake  Assessment:   Pt with history of CAD, chronic systolic heart failure last EF was 35-40%, rheumatic on prednisone, hypertension was brought to the ER after patient was having nausea and abdominal pain. In addition patient was found confused and patient's daughter who provided most of the history states that patient has been confused over the last week or so. Patient had a fall 2 days ago when she slipped and fell. She fell onto her back but did not hit her head and did not lose consciousness. Patient otherwise did not have any chest pain or shortness of breath. In the ER patient had extensive studies including CT head, neck on CT abdomen pelvis chest x-ray. Studies showed that patient has a metallic ring in her esophagus. Patient otherwise was found to have possible aspiration pneumonia and age indeterminant fractures of the thoracic spine and has sacral fracture.   1/29 - Pt out of room for EGD. Called pt's daughter to obtain dietary history, however got no answer. Per ED notes, pt has not had BM in 1 week.   1/30 - Visited with pt, who was asleep, however pt's daughter June present at bedside. She report pt eats 3 meals/day at home however she consumes small portion sizes, only 1/2 cup quantity of food at a time. States meals consist of oatmeal with bananas for breakfast, pizza or burger for lunch, and whatever she and her husband fix for dinner. Not on any nutritional supplements  at home. States pt weighed 118 pounds last January, now 109 pounds. Reports pt enjoys chocolate and candy. States pt ate 1/3 of her burger for lunch today and 2 fries. Agreeable to pt getting Ensure.   Potassium low Alk phos elevated   Height: Ht Readings from Last 1 Encounters:  11/08/13 5\' 1"  (1.549 m)    Weight Status:   Wt Readings from Last 1 Encounters:  11/08/13 109 lb 2 oz (49.5 kg)    Re-estimated needs:  Kcal: 1250-1450  Protein: 60-70g  Fluid: 1.2-1.4L/day   Skin: Intact   Diet Order: Dysphagia 3, thin   Intake/Output Summary (Last 24 hours) at 11/09/13 1538 Last data filed at 11/09/13 1413  Gross per 24 hour  Intake   1380 ml  Output    350 ml  Net   1030 ml    Last BM: PTA   Labs:   Recent Labs Lab 11/07/13 2120 11/08/13 0155  NA 132* 132*  K 4.3 3.6*  CL 94* 92*  CO2 26 26  BUN 19 19  CREATININE 0.98 0.90  CALCIUM 9.2 8.5  GLUCOSE 128* 98    CBG (last 3)   Recent Labs  11/08/13 0638  GLUCAP 94    Scheduled Meds: . aspirin  81 mg Oral Daily  . carvedilol  3.125 mg Oral BID WC  . [START ON 11/10/2013] levofloxacin  500 mg Oral Q48H  . pantoprazole  40 mg Oral Q1200  . polyethylene glycol  17  g Oral Daily  . predniSONE  5 mg Oral Q breakfast  . senna-docusate  1 tablet Oral BID    Continuous Infusions: . sodium chloride 50 mL/hr at 11/09/13 [place], [address], [place] Pager [phone] After Hours Pager

## 2013-11-09 NOTE — Care Management Note (Unsigned)
    Page 1 of 1   11/09/2013     11:43:53 AM   CARE MANAGEMENT NOTE 11/09/2013  Patient:  [name]   Account Number:  [id number]  Date Initiated:  11/08/2013  Documentation initiated by:  ,  Subjective/Objective Assessment:   80 year old female admitted with aspiration pneumonia.     Action/Plan:   From home, hx of falls. Needs PT consult to help determine d/c needs.   Anticipated DC Date:  11/10/2013   Anticipated DC Plan:  HOME W HOSPICE CARE      DC Planning Services  CM consult      Choice offered to / List presented to:             HH agency  [facility]   Status of service:  In process, will continue to follow Medicare Important Message given?  NA - LOS <3 / Initial given by admissions (If response is "NO", the following Medicare IM given date fields will be blank) Date Medicare IM given:   Date Additional Medicare IM given:    Discharge Disposition:    Per UR Regulation:  Reviewed for med. necessity/level of care/duration of stay  If discussed at Long Length of Stay Meetings, dates discussed:    Comments:  11/09/13   RN BSN I met with pts daughter at bedside to discuss d/c planning. She informed me that pt will be going home with hospice services provided by [facility]. I touched base with agency and confirmed this. They stated that they have worked with pt'Lam PCP Dr Nyland and have what they need for pt when she gets home. They however need the d/c summary faxed to [phone] once it is ready.    

## 2013-11-09 NOTE — Progress Notes (Signed)
Clinical Social Work  CSW received inappropriate consult for SNF placement. Per RN and MD patient will DC home with family and hospice services. CSW is signing off but available if needed.  [place]olly Anmol [place], [place] [phone]

## 2013-11-09 NOTE — Progress Notes (Signed)
[facility] PROGRESS NOTE  TANIQUE MATNEY BMW:[id number] DOB: 03-28-1924 DOA: 11/07/2013 PCP: Josue Hector, MD  Assessment/Plan:  1. Foreign body in the esophagus/metal ring -s/p removal by EGD yesterday -advance diet today -has distal esophageal stricture/narrowing  2. Aspiration pneumonia -clinically not very symptomatic -continue levaquin, has PCN allergy  3. Dementia -daughter reports cognitive decline and confusion off and on for >25months -TSH normal, B12 slightly low replace as started by PCP -CT head noted -mentation improved, slightly better than recent baseline per daughter  4. Possible Sacral fracture and age indeterminant multiple thoracic compression fractures -pain control, tylenol, takes vicodin PRN at home -physical therapy consult  5. History of systolic heart failure last EF measured in 2007 was 35-40% - patient is usually on Lasix. Presently on hold.  6. Hypertension  -stable, continue coreg  7. History of polymyalgia rheumatica  -resume home dose prednisone  8. History of CAD -stable  DVT proph: lovenox  Code Status: DNR,  discussed with daughter and son Family Communication: discussed with daughter at bedside Disposition Plan: home with hospice tomorrow   Consultants:  GI  Antibiotics:  levaquin  HPI/Subjective: Feels well, no complaints except for mild back pain  Objective: Filed Vitals:   11/09/13 0500  BP: 142/61  Pulse: 55  Temp: 98.2 F (36.8 C)  Resp: 16    Intake/Output Summary (Last 24 hours) at 11/09/13 0943 Last data filed at 11/09/13 0600  Gross per 24 hour  Intake  27253 ml  Output    100 ml  Net  28940 ml   Filed Weights   11/08/13 0120  Weight: 49.5 kg (109 lb 2 oz)    Exam:   General:  Alert, awake, oriented to self, place, very frail, emaciated  Cardiovascular: S1S2/RRR  Respiratory: diminished BS at bases  Abdomen: soft, NT, BS present  Musculoskeletal: no edema c/c  Data  Reviewed: Basic Metabolic Panel:  Recent Labs Lab 11/07/13 2120 11/08/13 0155  NA 132* 132*  K 4.3 3.6*  CL 94* 92*  CO2 26 26  GLUCOSE 128* 98  BUN 19 19  CREATININE 0.98 0.90  CALCIUM 9.2 8.5   Liver Function Tests:  Recent Labs Lab 11/07/13 2120 11/08/13 0155  AST 13 13  ALT 5 5  ALKPHOS 191* 168*  BILITOT 0.6 0.7  PROT 6.9 5.9*  ALBUMIN 3.5 3.1*    Recent Labs Lab 11/07/13 2120  LIPASE 16    Recent Labs Lab 11/08/13 0155  AMMONIA 10*   CBC:  Recent Labs Lab 11/07/13 2120 11/08/13 0155  WBC 11.3* 9.0  NEUTROABS 9.0*  --   HGB 13.9 12.7  HCT 41.3 37.4  MCV 97.2 96.1  PLT 357 324   Cardiac Enzymes:  Recent Labs Lab 11/08/13 0155  CKTOTAL 18  TROPONINI <0.30   BNP (last 3 results) No results found for this basename: PROBNP,  in the last 8760 hours CBG:  Recent Labs Lab 11/08/13 0638  GLUCAP 94    No results found for this or any previous visit (from the past 240 hour(s)).   Studies: Dg Chest 2 View  11/07/2013   CLINICAL DATA:  Fall  EXAM: CHEST  2 VIEW  COMPARISON:  11/25/2010 CT  FINDINGS: A metallic ring projects over the midline at the thoracic inlet. Aortic atherosclerosis. There is cardiac enlargement. Left greater than right pleural effusions. No pneumothorax. Limited osseous evaluation due to severe osteopenia. There are multilevel compression deformities that are poorly evaluated on the study. See contemporaneous thoracic  spine radiograph report.  IMPRESSION: Left greater right pleural effusions.  Cardiomegaly.  A metallic ring projects over the midline at the thoracic inlet and presumed external. Confirm clinically.   Electronically Signed   By: Jearld Lesch M.D.   On: 11/07/2013 22:27   Dg Thoracic Spine 2 View  11/07/2013   CLINICAL DATA:  Fall, back pain  EXAM: THORACIC SPINE - 2 VIEW  COMPARISON:  11/25/2010 chest CT  FINDINGS: See separate lumbar spine radiograph report. Diffuse osteopenia. Compression fractures at T11,  T10, T8, T7, T6. Mild kyphosis. Mild rightward curvature.  IMPRESSION: 50% height loss T7 and T8 compression fractures, not appreciated in 2012. Correlate for point tenderness. Otherwise, compression fractures are similar to prior.   Electronically Signed   By: Jearld Lesch M.D.   On: 11/07/2013 22:40   Dg Lumbar Spine Complete  11/07/2013   CLINICAL DATA:  Fall, low back pain  EXAM: LUMBAR SPINE - COMPLETE 4+ VIEW  COMPARISON:  09/17/2011 CT  FINDINGS: Severe diffuse osteopenia. There are multilevel compression fractures including L2, L1, T11, T10. Similar to prior. Rightward curvature of the lumbar spine. A nondisplaced fracture of the lower sacrum is suspected  IMPRESSION: T10, T11, L1, L2 vertebral body compression fractures are similar to prior.  Nondisplaced lower sacral fracture suspected.   Electronically Signed   By: Jearld Lesch M.D.   On: 11/07/2013 22:36   Dg Pelvis 1-2 Views  11/07/2013   CLINICAL DATA:  Fall  EXAM: PELVIS - 1-2 VIEW  COMPARISON:  09/17/2011 CT  FINDINGS: Osteopenia. No displaced pelvic fracture or dislocation identified. Sacrum obscured by overlying bowel.  IMPRESSION: Sacrum obscured by overlying bowel and cannot be evaluated. Otherwise, no displaced fracture of the pelvis identified.   Electronically Signed   By: Jearld Lesch M.D.   On: 11/07/2013 22:32   Ct Head Wo Contrast  11/07/2013   CLINICAL DATA:  Altered mental status, recent fall  EXAM: CT HEAD WITHOUT CONTRAST  CT CERVICAL SPINE WITHOUT CONTRAST  TECHNIQUE: Multidetector CT imaging of the head and cervical spine was performed following the standard protocol without intravenous contrast. Multiplanar CT image reconstructions of the cervical spine were also generated.  COMPARISON:  09/20/2006  FINDINGS: CT HEAD FINDINGS  Prominence of the sulci, cisterns, and ventricles, in keeping with volume loss. No overt hydrocephalus. No definite CT evidence of an acute infarction. Periventricular and subcortical  white matter hypodensities, a nonspecific finding often seen in the setting of chronic microangiopathic change. No intraparenchymal hemorrhage, mass, mass effect, or abnormal extra-axial fluid collection. Atherosclerotic vascular calcifications. Visualized paranasal sinuses and mastoid air cells are predominantly clear. Lens replacements. No displaced calvarial fracture.  CT CERVICAL SPINE FINDINGS  Diffuse osteopenia. Maintained craniocervical relationship. No dens fracture. Minimal retrolisthesis of C5 on C6. Disc osteophyte complexes at C4-5, C5-6, and C6-7 result in mild-to-moderate central canal narrowing. There is a 1.8 cm metallic ring within the upper esophagus, resulting in significant streak artifact at the level of the C7 vertebral body. Mild apical scarring.  IMPRESSION: Volume loss and white matter changes as above. No definite CT evidence of an acute intracranial abnormality  Mild multilevel degenerative changes. No acute osseous finding of the cervical spine.  Metallic ring within the upper esophagus may reflect an ingested foreign body. Correlate clinically.   Electronically Signed   By: Jearld Lesch M.D.   On: 11/07/2013 22:59   Ct Cervical Spine Wo Contrast  11/07/2013   CLINICAL DATA:  Altered mental status, recent fall  EXAM: CT HEAD WITHOUT CONTRAST  CT CERVICAL SPINE WITHOUT CONTRAST  TECHNIQUE: Multidetector CT imaging of the head and cervical spine was performed following the standard protocol without intravenous contrast. Multiplanar CT image reconstructions of the cervical spine were also generated.  COMPARISON:  09/20/2006  FINDINGS: CT HEAD FINDINGS  Prominence of the sulci, cisterns, and ventricles, in keeping with volume loss. No overt hydrocephalus. No definite CT evidence of an acute infarction. Periventricular and subcortical white matter hypodensities, a nonspecific finding often seen in the setting of chronic microangiopathic change. No intraparenchymal hemorrhage, mass,  mass effect, or abnormal extra-axial fluid collection. Atherosclerotic vascular calcifications. Visualized paranasal sinuses and mastoid air cells are predominantly clear. Lens replacements. No displaced calvarial fracture.  CT CERVICAL SPINE FINDINGS  Diffuse osteopenia. Maintained craniocervical relationship. No dens fracture. Minimal retrolisthesis of C5 on C6. Disc osteophyte complexes at C4-5, C5-6, and C6-7 result in mild-to-moderate central canal narrowing. There is a 1.8 cm metallic ring within the upper esophagus, resulting in significant streak artifact at the level of the C7 vertebral body. Mild apical scarring.  IMPRESSION: Volume loss and white matter changes as above. No definite CT evidence of an acute intracranial abnormality  Mild multilevel degenerative changes. No acute osseous finding of the cervical spine.  Metallic ring within the upper esophagus may reflect an ingested foreign body. Correlate clinically.   Electronically Signed   By: Jearld LeschAndrew  DelGaizo M.D.   On: 11/07/2013 22:59   Ct Abdomen Pelvis W Contrast  11/07/2013   CLINICAL DATA:  Fall, diffuse abdominal pain  EXAM: CT ABDOMEN AND PELVIS WITH CONTRAST  TECHNIQUE: Multidetector CT imaging of the abdomen and pelvis was performed using the standard protocol following bolus administration of intravenous contrast.  CONTRAST:  80mL OMNIPAQUE IOHEXOL 300 MG/ML  SOLN  COMPARISON:  09/17/2011  FINDINGS: Degraded by respiratory motion. Further degraded by extremity positioning.  Cardiac enlargement. Small pericardial effusion. Coronary artery calcifications. Leftward shift of the heart. Left lower lobe linear opacity, most in keeping with atelectasis or scarring.  Nonspecific hypodensities within the liver are similar to prior with the exception of a previously noted hepatic dome cyst which is not seen today. No appreciable abnormality of the spleen, pancreas, biliary system, adrenal glands. Symmetric renal enhancement. No  hydroureteronephrosis.  Large stool burden. No overt colitis. No bowel obstruction. Decompressed stomach with nonspecific mild wall thickening, similar to prior. No free intraperitoneal air. Small amount of free fluid dependently within the pelvis.  Thin walled bladder.  Uterus not seen.  No adnexal mass.  Scattered atherosclerosis of the aorta and branch vessels without aneurysmal dilatation.  Diffuse osteopenia. A nondisplaced lower sacral fracture is again suspected on series 2, image 54. The L5 transverse process articulates with the iliac bone on the left, similar to prior. L2, L1, T11, T10 compression fractures are similar to prior.  IMPRESSION: Degraded by motion.  A nondisplaced lower sacral fracture is suspected. Correlate clinically.  Lower thoracic and lumbar compression fractures are similar prior.  Left lower lobe opacity may reflect atelectasis, pneumonia, or aspiration.  Cardiomegaly.   Electronically Signed   By: Jearld LeschAndrew  DelGaizo M.D.   On: 11/07/2013 23:18   Dg Chest Port 1 View  11/08/2013   CLINICAL DATA:  Reassess positioning of metallic ring in the proximal esophagus.  EXAM: PORTABLE CHEST - 1 VIEW  COMPARISON:  DG CHEST 2 VIEW dated 11/07/2013  FINDINGS: The position of the metallic ring in the proximal esophagus appears unchanged. It lies at the level of  the thoracic inlet. The lungs are hypoinflated. There is stable density at the left lung base. The cardiac silhouette remains enlarged.  IMPRESSION: There has not been significant change in the position of the ring-like foreign body in the proximal esophagus.   Electronically Signed   By: David  [place]   On: 11/08/2013 11:29    Scheduled Meds: . aspirin  81 mg Oral Daily  . carvedilol  3.125 mg Oral BID WC  . cyanocobalamin  1,000 mcg Subcutaneous Once  . levofloxacin (LEVAQUIN) IV  750 mg Intravenous Q48H  . pantoprazole  40 mg Oral Q1200  . predniSONE  5 mg Oral Q breakfast   Continuous Infusions: . sodium chloride 75 mL/hr at  11/08/13 1813    Active Problems:   CAD, NATIVE VESSEL   SYSTOLIC HEART FAILURE, CHRONIC   Sacral fracture   Compression fracture   Foreign body in esophagus   Aspiration pneumonia   Polymyalgia rheumatica   Acute encephalopathy    Time spent:    [facility]  [facility] Pager [phone]. If 7PM-7AM, please contact night-coverage at [email or web address], password [facility] 11/09/2013, 9:43 AM  LOS: 2 days

## 2013-11-10 LAB — CBC
HEMATOCRIT: 34.5 % — AB (ref 36.0–46.0)
HEMOGLOBIN: 11.6 g/dL — AB (ref 12.0–15.0)
MCH: 33 pg (ref 26.0–34.0)
MCHC: 33.6 g/dL (ref 30.0–36.0)
MCV: 98 fL (ref 78.0–100.0)
Platelets: 293 10*3/uL (ref 150–400)
RBC: 3.52 MIL/uL — ABNORMAL LOW (ref 3.87–5.11)
RDW: 13.8 % (ref 11.5–15.5)
WBC: 8.2 10*3/uL (ref 4.0–10.5)

## 2013-11-10 LAB — BASIC METABOLIC PANEL
BUN: 13 mg/dL (ref 6–23)
CALCIUM: 8.2 mg/dL — AB (ref 8.4–10.5)
CO2: 24 mEq/L (ref 19–32)
Chloride: 102 mEq/L (ref 96–112)
Creatinine, Ser: 0.85 mg/dL (ref 0.50–1.10)
GFR calc Af Amer: 69 mL/min — ABNORMAL LOW (ref >90)
GFR calc non Af Amer: 60 mL/min — ABNORMAL LOW (ref >90)
GLUCOSE: 91 mg/dL (ref 70–99)
Potassium: 4 mEq/L (ref 3.7–5.3)
Sodium: 137 mEq/L (ref 137–147)

## 2013-11-10 MED ORDER — HYDROCODONE-ACETAMINOPHEN 5-500 MG PO TABS: 1.0000 | ORAL_TABLET | Freq: Four times a day (QID) | ORAL | Status: AC | PRN

## 2013-11-10 MED ORDER — POLYETHYLENE GLYCOL 3350 17 G PO PACK: 17.0000 g | PACK | Freq: Every day | ORAL | Status: AC

## 2013-11-10 MED ORDER — LEVOFLOXACIN 500 MG PO TABS: 500.0000 mg | ORAL_TABLET | ORAL | Status: AC

## 2013-11-10 NOTE — Progress Notes (Signed)
Call received again from [place] [place] to say that patient is not with hteir service. Daughter in the room and asked if pt was with [facility] [place]; but dtr said "no" and that pt had service with home health and that they had already been contacted about pt's discharge.  this rn then  apologized to chris for the misinformation. Ofilia Neasvwilliams,rn

## 2013-11-10 NOTE — Progress Notes (Signed)
Chris from [facility] called to confirmed that he received message of patient's discharge to home. He York SpanielSaid "thank you" for letting them know. Ofilia Neasvwilliams,rn

## 2013-11-10 NOTE — Progress Notes (Signed)
Pt discharged to home. DC instructions given with daughter at bedside. Concerns discussed with daughter, who voiced them. d[facility] 2[facility]. This rn suggested the option of having them filled here in [place]. dtr decided would fill them at [facility]. Pt given prescriptions for 2 drugs. Left unit in wheelchair pushed by nurse tech. Left in good condition. Vwilliams,rn.

## 2013-11-10 NOTE — Progress Notes (Signed)
[facility] notified of patient's discharge. Vwilliams,rn.

## 2013-11-12 NOTE — Progress Notes (Signed)
11/12/2013 1550 NCM spoke to [facility] on call RN and they have necessary clinicals. Hospice Rep obtained today. Made aware on dc summary was available. Isidoro DonningAlesia Shoni Quijas RN CCM Case Mgmt phone [phone]

## 2013-12-02 NOTE — Discharge Summary (Signed)
Physician Discharge Summary  [name] S Bissonette ZOX:[id number] DOB: 11/14/1923 DOA: 11/07/2013  PCP: Josue HectorNYLAND,LEONARD ROBERT, MD  Admit date: 11/07/2013 Discharge date: 11/10/2013  Time spent: 45 minutes  Recommendations for Outpatient Follow-up:  1. PCP in 1 week  Discharge Diagnoses:      Compression fracture   Foreign body in esophagus   Aspiration pneumonia   Polymyalgia rheumatica   Acute encephalopathy   CAD, NATIVE VESSEL   SYSTOLIC HEART FAILURE, CHRONIC   Sacral fracture   Mild Dementia  Discharge Condition:improved  Diet recommendation: regular  Filed Weights   11/08/13 0120  Weight: 49.5 kg (109 lb 2 oz)    History of present illness:  HPI: [name] S Easterwood is a 80 y.o. female with history of CAD, chronic systolic heart failure last EF was 35-40%, rheumatic on prednisone, hypertension was brought to the ER after patient was having nausea and abdominal pain. In addition patient was found confused and patient's daughter who provided most of the history states that patient has been confused over the last week or so. Patient had a fall 2 days ago when she slipped and fell. She fell onto her back but did not hit her head and did not lose consciousness. Patient otherwise did not have any chest pain or shortness of breath. In the ER patient had extensive studies including CT head, neck on CT abdomen pelvis chest x-ray. Studies showed that patient has a metallic ring in her esophagus. Patient otherwise was found to have possible aspiration pneumonia and age indeterminant fractures of the thoracic spine and has sacral fracture. Patient on my exam is oriented to her name and follows commands. Patient did not complain of any chest pain or shortness of breath. On-call gastroenterologist was consulted by ER physician and they will be seeing patient in consult for the swallowed metallic ring   Hospital Course:  1. Foreign body in the esophagus/metal ring  -s/p removal by EGD 1/29 -diet  advanced  -has distal esophageal stricture/narrowing  -Fu with GI as needed  2. Aspiration pneumonia  -clinically not very symptomatic  -treated with levaquin, has PCN allergy , complete course  3. Dementia  -daughter reports cognitive decline and confusion off and on for >376months  -TSH normal, B12 slightly low replace as started by PCP  -CT head noted  -mentation improved, slightly better than recent baseline per daughter   4. Possible Sacral fracture and age indeterminant multiple thoracic compression fractures  -pain control, tylenol, takes vicodin PRN at home  -physical therapy eval completed, discharged to rehab  5. History of systolic heart failure last EF measured in 2007 was 35-40% - patient is usually on Lasix.  -resumed at discharge -stable  6. Hypertension  -stable, continue coreg   7. History of polymyalgia rheumatica  -resumed home dose prednisone   8. History of CAD  -stable   Procedures:  EGD: Foreign body removal  Consultations:  GI  Discharge Exam: Filed Vitals:   11/10/13 0648  BP: 154/72  Pulse: 64  Temp: 98.2 F (36.8 C)  Resp: 20    General: AAOx3 Cardiovascular:S1S2/RRR Respiratory: CTAB  Discharge Instructions  Discharge Orders   Future Orders Complete By Expires   Diet - low sodium heart healthy  As directed    Increase activity slowly  As directed        Medication List         aspirin 81 MG tablet  Take 81 mg by mouth every other day.     calcium  carbonate 600 MG Tabs tablet  Commonly known as:  OS-CAL  Take 600 mg by mouth daily with breakfast.     carvedilol 3.125 MG tablet  Commonly known as:  COREG  TAKE  (1)  TABLET TWICE A DAY.     cholecalciferol 1000 UNITS tablet  Commonly known as:  VITAMIN D  Take 1,000 Units by mouth daily.     docusate sodium 100 MG capsule  Commonly known as:  COLACE  Take 100 mg by mouth 2 (two) times daily.     furosemide 20 MG tablet  Commonly known as:  LASIX  TAKE 1  TABLET DAILY;MAY TAKE EXTRA DOSE ONLY IF WEIGHT GAIN OF 3 POUNDS     glycerin adult 2 G Supp  Generic drug:  glycerin adult  Place 1 suppository rectally once.     hydrALAZINE 10 MG tablet  Commonly known as:  APRESOLINE  Take 1 tablet (10 mg total) by mouth 2 (two) times daily.     HYDROcodone-acetaminophen 5-500 MG per tablet  Commonly known as:  VICODIN  Take 1 tablet by mouth every 6 (six) hours as needed.     levofloxacin 500 MG tablet  Commonly known as:  LEVAQUIN  Take 1 tablet (500 mg total) by mouth every other day. Stop on 2/4     nitroGLYCERIN 0.4 MG SL tablet  Commonly known as:  NITROSTAT  Place 0.4 mg under the tongue every 5 (five) minutes as needed.     polyethylene glycol packet  Commonly known as:  MIRALAX / GLYCOLAX  Take 17 g by mouth daily.     potassium chloride SA 20 MEQ tablet  Commonly known as:  K-DUR  Take 1 tablet (20 mEq total) by mouth daily.     predniSONE 5 MG tablet  Commonly known as:  DELTASONE  Take 5 mg by mouth daily.     vitamin C 1000 MG tablet  Take 1,000 mg by mouth daily.       Allergies  Allergen Reactions  . Meperidine Hcl Swelling    Swelling of the neck  . Penicillins Rash       Follow-up Information   Follow up with Josue Hector, MD. Schedule an appointment as soon as possible for a visit in 1 week.   Specialty:  Family Medicine   Contact information:   [address] [place] [address] [phone]        The results of significant diagnostics from this hospitalization (including imaging, microbiology, ancillary and laboratory) are listed below for reference.    Significant Diagnostic Studies: Dg Chest 2 View  11/07/2013   CLINICAL DATA:  Fall  EXAM: CHEST  2 VIEW  COMPARISON:  11/25/2010 CT  FINDINGS: A metallic ring projects over the midline at the thoracic inlet. Aortic atherosclerosis. There is cardiac enlargement. Left greater than right pleural effusions. No pneumothorax. Limited osseous  evaluation due to severe osteopenia. There are multilevel compression deformities that are poorly evaluated on the study. See contemporaneous thoracic spine radiograph report.  IMPRESSION: Left greater right pleural effusions.  Cardiomegaly.  A metallic ring projects over the midline at the thoracic inlet and presumed external. Confirm clinically.   Electronically Signed   By: Jearld Lesch M.D.   On: 11/07/2013 22:27   Dg Thoracic Spine 2 View  11/07/2013   CLINICAL DATA:  Fall, back pain  EXAM: THORACIC SPINE - 2 VIEW  COMPARISON:  11/25/2010 chest CT  FINDINGS: See separate lumbar spine radiograph report. Diffuse  osteopenia. Compression fractures at T11, T10, T8, T7, T6. Mild kyphosis. Mild rightward curvature.  IMPRESSION: 50% height loss T7 and T8 compression fractures, not appreciated in 2012. Correlate for point tenderness. Otherwise, compression fractures are similar to prior.   Electronically Signed   By: Jearld Lesch M.D.   On: 11/07/2013 22:40   Dg Lumbar Spine Complete  11/07/2013   CLINICAL DATA:  Fall, low back pain  EXAM: LUMBAR SPINE - COMPLETE 4+ VIEW  COMPARISON:  09/17/2011 CT  FINDINGS: Severe diffuse osteopenia. There are multilevel compression fractures including L2, L1, T11, T10. Similar to prior. Rightward curvature of the lumbar spine. A nondisplaced fracture of the lower sacrum is suspected  IMPRESSION: T10, T11, L1, L2 vertebral body compression fractures are similar to prior.  Nondisplaced lower sacral fracture suspected.   Electronically Signed   By: Jearld Lesch M.D.   On: 11/07/2013 22:36   Dg Pelvis 1-2 Views  11/07/2013   CLINICAL DATA:  Fall  EXAM: PELVIS - 1-2 VIEW  COMPARISON:  09/17/2011 CT  FINDINGS: Osteopenia. No displaced pelvic fracture or dislocation identified. Sacrum obscured by overlying bowel.  IMPRESSION: Sacrum obscured by overlying bowel and cannot be evaluated. Otherwise, no displaced fracture of the pelvis identified.   Electronically Signed    By: Jearld Lesch M.D.   On: 11/07/2013 22:32   Ct Head Wo Contrast  11/07/2013   CLINICAL DATA:  Altered mental status, recent fall  EXAM: CT HEAD WITHOUT CONTRAST  CT CERVICAL SPINE WITHOUT CONTRAST  TECHNIQUE: Multidetector CT imaging of the head and cervical spine was performed following the standard protocol without intravenous contrast. Multiplanar CT image reconstructions of the cervical spine were also generated.  COMPARISON:  09/20/2006  FINDINGS: CT HEAD FINDINGS  Prominence of the sulci, cisterns, and ventricles, in keeping with volume loss. No overt hydrocephalus. No definite CT evidence of an acute infarction. Periventricular and subcortical white matter hypodensities, a nonspecific finding often seen in the setting of chronic microangiopathic change. No intraparenchymal hemorrhage, mass, mass effect, or abnormal extra-axial fluid collection. Atherosclerotic vascular calcifications. Visualized paranasal sinuses and mastoid air cells are predominantly clear. Lens replacements. No displaced calvarial fracture.  CT CERVICAL SPINE FINDINGS  Diffuse osteopenia. Maintained craniocervical relationship. No dens fracture. Minimal retrolisthesis of C5 on C6. Disc osteophyte complexes at C4-5, C5-6, and C6-7 result in mild-to-moderate central canal narrowing. There is a 1.8 cm metallic ring within the upper esophagus, resulting in significant streak artifact at the level of the C7 vertebral body. Mild apical scarring.  IMPRESSION: Volume loss and white matter changes as above. No definite CT evidence of an acute intracranial abnormality  Mild multilevel degenerative changes. No acute osseous finding of the cervical spine.  Metallic ring within the upper esophagus may reflect an ingested foreign body. Correlate clinically.   Electronically Signed   By: Jearld Lesch M.D.   On: 11/07/2013 22:59   Ct Cervical Spine Wo Contrast  11/07/2013   CLINICAL DATA:  Altered mental status, recent fall  EXAM: CT HEAD  WITHOUT CONTRAST  CT CERVICAL SPINE WITHOUT CONTRAST  TECHNIQUE: Multidetector CT imaging of the head and cervical spine was performed following the standard protocol without intravenous contrast. Multiplanar CT image reconstructions of the cervical spine were also generated.  COMPARISON:  09/20/2006  FINDINGS: CT HEAD FINDINGS  Prominence of the sulci, cisterns, and ventricles, in keeping with volume loss. No overt hydrocephalus. No definite CT evidence of an acute infarction. Periventricular and subcortical white matter hypodensities, a nonspecific  finding often seen in the setting of chronic microangiopathic change. No intraparenchymal hemorrhage, mass, mass effect, or abnormal extra-axial fluid collection. Atherosclerotic vascular calcifications. Visualized paranasal sinuses and mastoid air cells are predominantly clear. Lens replacements. No displaced calvarial fracture.  CT CERVICAL SPINE FINDINGS  Diffuse osteopenia. Maintained craniocervical relationship. No dens fracture. Minimal retrolisthesis of C5 on C6. Disc osteophyte complexes at C4-5, C5-6, and C6-7 result in mild-to-moderate central canal narrowing. There is a 1.8 cm metallic ring within the upper esophagus, resulting in significant streak artifact at the level of the C7 vertebral body. Mild apical scarring.  IMPRESSION: Volume loss and white matter changes as above. No definite CT evidence of an acute intracranial abnormality  Mild multilevel degenerative changes. No acute osseous finding of the cervical spine.  Metallic ring within the upper esophagus may reflect an ingested foreign body. Correlate clinically.   Electronically Signed   By: Jearld Lesch M.D.   On: 11/07/2013 22:59   Ct Abdomen Pelvis W Contrast  11/07/2013   CLINICAL DATA:  Fall, diffuse abdominal pain  EXAM: CT ABDOMEN AND PELVIS WITH CONTRAST  TECHNIQUE: Multidetector CT imaging of the abdomen and pelvis was performed using the standard protocol following bolus  administration of intravenous contrast.  CONTRAST:  80mL OMNIPAQUE IOHEXOL 300 MG/ML  SOLN  COMPARISON:  09/17/2011  FINDINGS: Degraded by respiratory motion. Further degraded by extremity positioning.  Cardiac enlargement. Small pericardial effusion. Coronary artery calcifications. Leftward shift of the heart. Left lower lobe linear opacity, most in keeping with atelectasis or scarring.  Nonspecific hypodensities within the liver are similar to prior with the exception of a previously noted hepatic dome cyst which is not seen today. No appreciable abnormality of the spleen, pancreas, biliary system, adrenal glands. Symmetric renal enhancement. No hydroureteronephrosis.  Large stool burden. No overt colitis. No bowel obstruction. Decompressed stomach with nonspecific mild wall thickening, similar to prior. No free intraperitoneal air. Small amount of free fluid dependently within the pelvis.  Thin walled bladder.  Uterus not seen.  No adnexal mass.  Scattered atherosclerosis of the aorta and branch vessels without aneurysmal dilatation.  Diffuse osteopenia. A nondisplaced lower sacral fracture is again suspected on series 2, image 54. The L5 transverse process articulates with the iliac bone on the left, similar to prior. L2, L1, T11, T10 compression fractures are similar to prior.  IMPRESSION: Degraded by motion.  A nondisplaced lower sacral fracture is suspected. Correlate clinically.  Lower thoracic and lumbar compression fractures are similar prior.  Left lower lobe opacity may reflect atelectasis, pneumonia, or aspiration.  Cardiomegaly.   Electronically Signed   By: Jearld Lesch M.D.   On: 11/07/2013 23:18   Dg Chest Port 1 View  11/08/2013   CLINICAL DATA:  Reassess positioning of metallic ring in the proximal esophagus.  EXAM: PORTABLE CHEST - 1 VIEW  COMPARISON:  DG CHEST 2 VIEW dated 11/07/2013  FINDINGS: The position of the metallic ring in the proximal esophagus appears unchanged. It lies at the  level of the thoracic inlet. The lungs are hypoinflated. There is stable density at the left lung base. The cardiac silhouette remains enlarged.  IMPRESSION: There has not been significant change in the position of the ring-like foreign body in the proximal esophagus.   Electronically Signed   By: David  [place]   On: 11/08/2013 11:29    Microbiology: No results found for this or any previous visit (from the past 240 hour(s)).   Labs: Basic Metabolic Panel: No results found  for this basename: NA, K, CL, CO2, GLUCOSE, BUN, CREATININE, CALCIUM, MG, PHOS,  in the last 168 hours Liver Function Tests: No results found for this basename: AST, ALT, ALKPHOS, BILITOT, PROT, ALBUMIN,  in the last 168 hours No results found for this basename: LIPASE, AMYLASE,  in the last 168 hours No results found for this basename: AMMONIA,  in the last 168 hours CBC: No results found for this basename: WBC, NEUTROABS, HGB, HCT, MCV, PLT,  in the last 168 hours Cardiac Enzymes: No results found for this basename: CKTOTAL, CKMB, CKMBINDEX, TROPONINI,  in the last 168 hours BNP: BNP (last 3 results) No results found for this basename: PROBNP,  in the last 8760 hours CBG: No results found for this basename: GLUCAP,  in the last 168 hours     Signed:  Noha Karasik  [facility] 12/02/2013, 8:24 PM

## 2013-12-03 ENCOUNTER — Other Ambulatory Visit: Payer: Self-pay | Admitting: Cardiology

## 2013-12-03 NOTE — Telephone Encounter (Signed)
Disp Refills Start End    carvedilol (COREG) 3.125 MG tablet 180 tablet 0 08/28/2013      Sig:  TAKE  (1)  TABLET TWICE A DAY.    Class:  Normal    DAW:  No    Comment:  NO FURTHER REFILLS WITHOUT APPOINTMENT    Authorizing Provider:  Lars MassonKatarina H Nelson, MD    Ordering User:  Valrie HartMindy L Isley, CMA   NO FOLLOW UP APPT SCHEDULED

## 2013-12-18 ENCOUNTER — Other Ambulatory Visit: Payer: Self-pay | Admitting: Cardiology

## 2014-02-20 ENCOUNTER — Other Ambulatory Visit: Payer: Self-pay | Admitting: Cardiology

## 2014-02-26 ENCOUNTER — Other Ambulatory Visit: Payer: Self-pay | Admitting: Cardiology

## 2014-02-27 ENCOUNTER — Other Ambulatory Visit: Payer: Self-pay

## 2014-07-04 ENCOUNTER — Other Ambulatory Visit: Payer: Self-pay | Admitting: Cardiology

## 2015-02-12 IMAGING — CR DG CHEST 2V
2 series · 2 of 2 positions shown · non-contrast
Comparison: 11/25/2010 CT

CLINICAL DATA: Fall

EXAM:
CHEST  2 VIEW

[w chest lat]
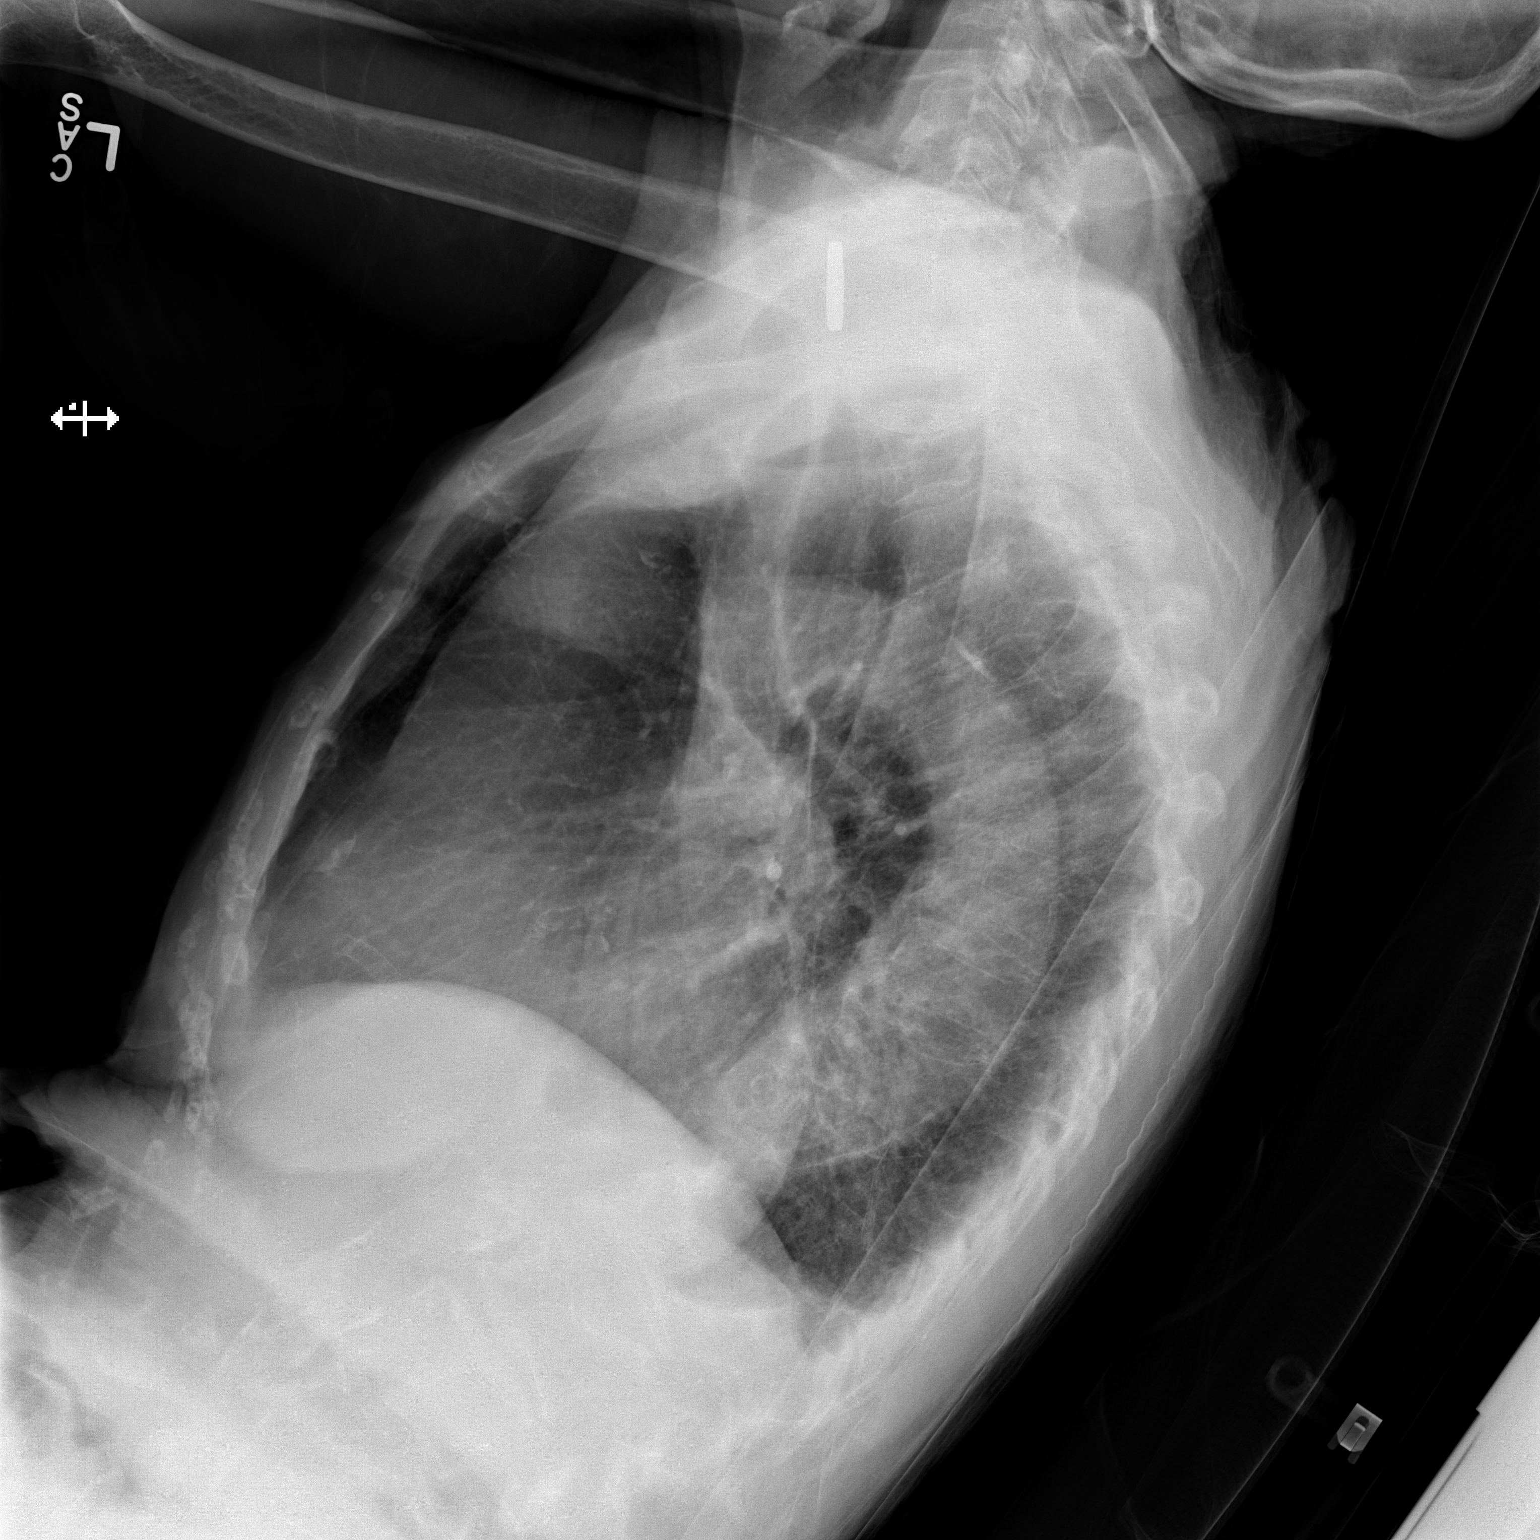

[x chest ap]
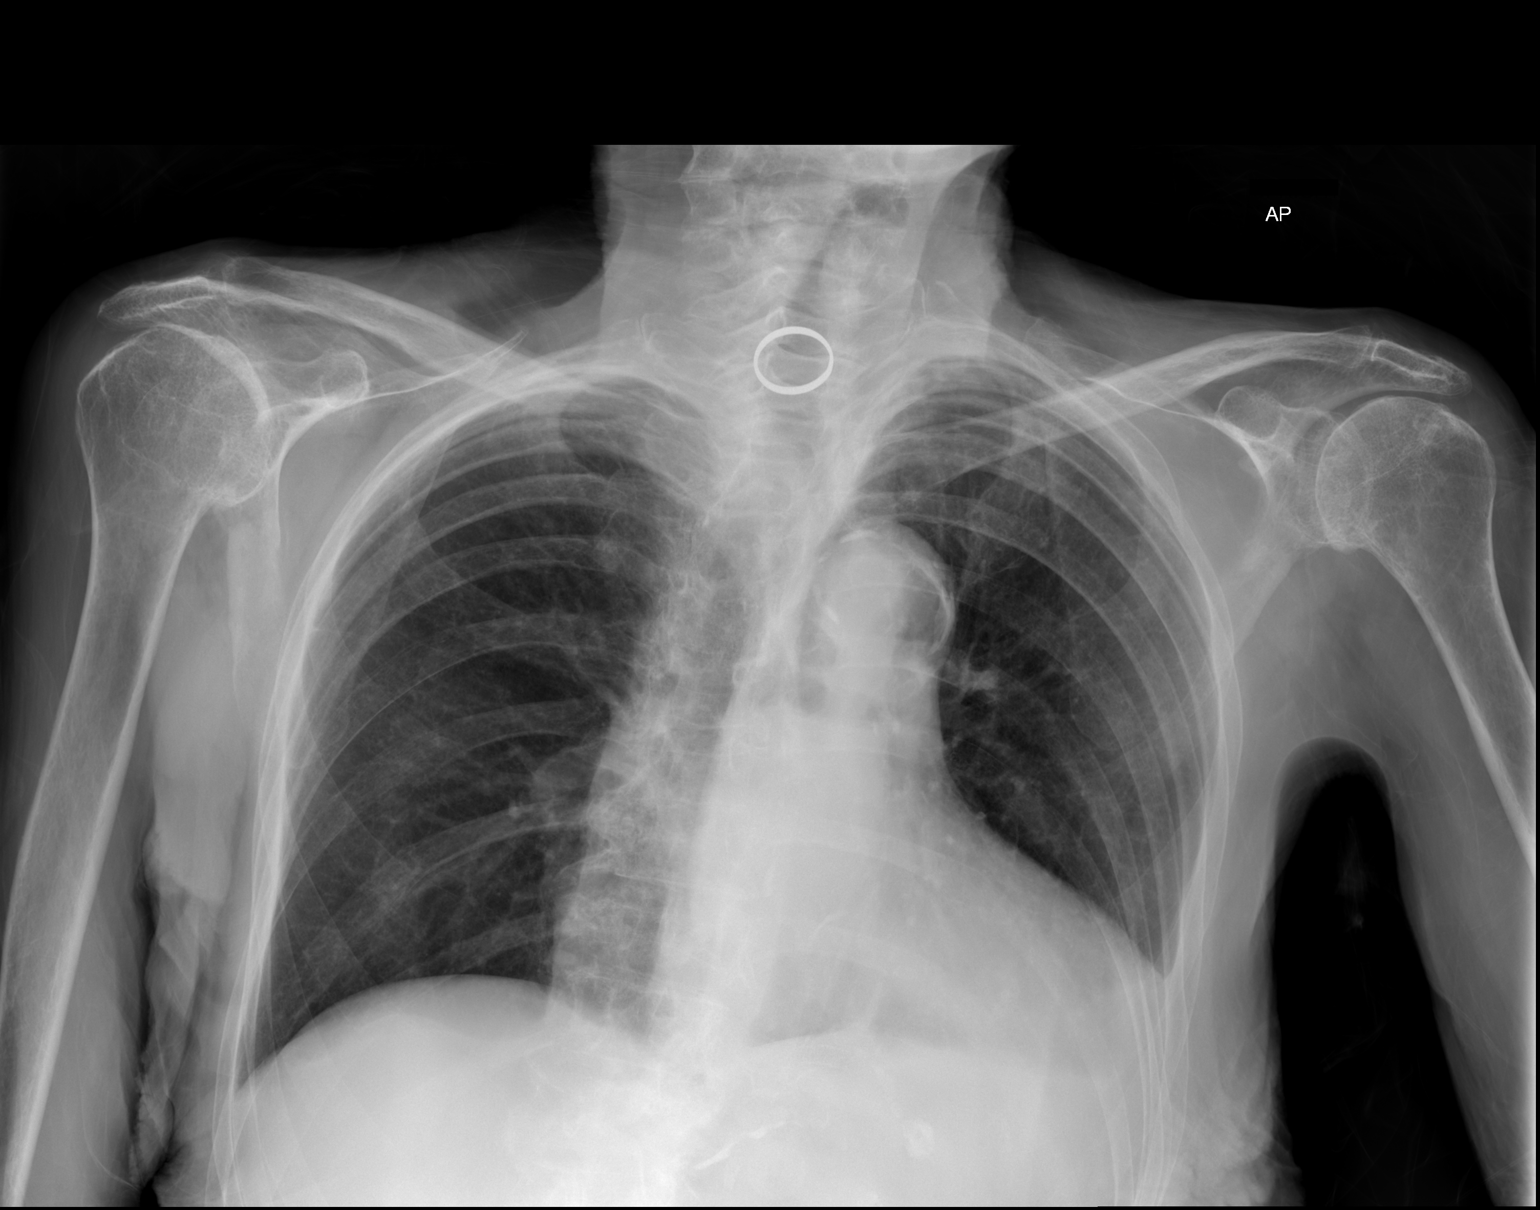

[2 of 2 positions shown; findings below may reference images not displayed]

FINDINGS: A metallic ring projects over the midline at the thoracic inlet.
Aortic atherosclerosis. There is cardiac enlargement. Left greater
than right pleural effusions. No pneumothorax. Limited osseous
evaluation due to severe osteopenia. There are multilevel
compression deformities that are poorly evaluated on the study. See
contemporaneous thoracic spine radiograph report.
IMPRESSION: Left greater right pleural effusions.

Cardiomegaly.

A metallic ring projects over the midline at the thoracic inlet and
presumed external. Confirm clinically.

## 2015-02-12 IMAGING — CT CT CERVICAL SPINE W/O CM
1 of 3 series · 6 of 14 positions shown, 8 images · non-contrast
Comparison: 09/20/2006

CLINICAL DATA: Altered mental status, recent fall

EXAM:
CT HEAD WITHOUT CONTRAST
CT CERVICAL SPINE WITHOUT CONTRAST
TECHNIQUE: Multidetector CT imaging of the head and cervical spine was
performed following the standard protocol without intravenous
contrast. Multiplanar CT image reconstructions of the cervical spine
were also generated.

[Series 4: c-spine st · axial · 0.26mm/px · z∈[-266,-152]mm · 6 of 80 slices shown, 8 images]
[im 12/80  soft-tissue]
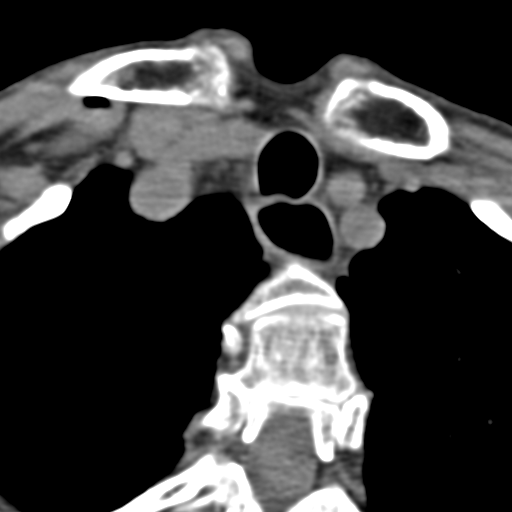
[im 12/80  bone]
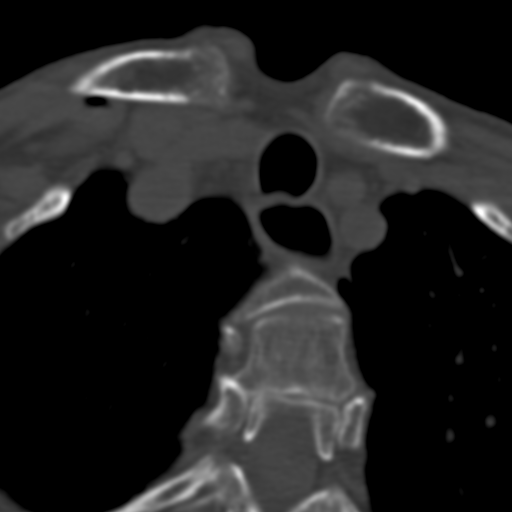
[im 23/80  bone]
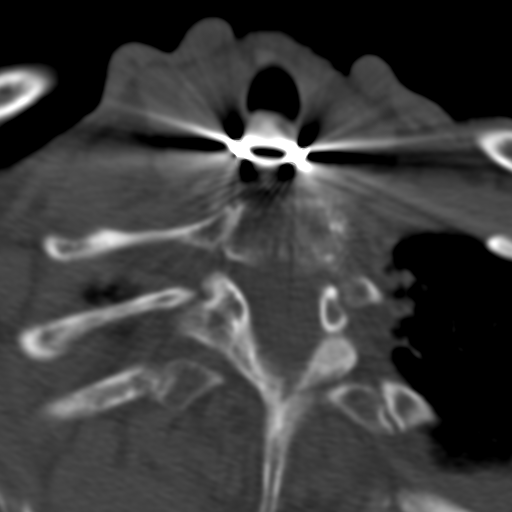
[im 34/80  bone]
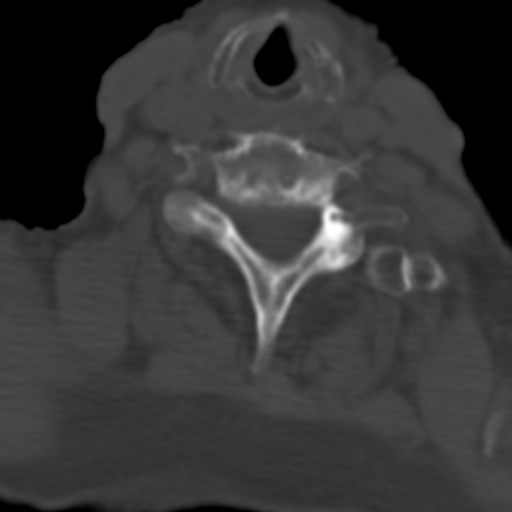
[im 46/80  bone]
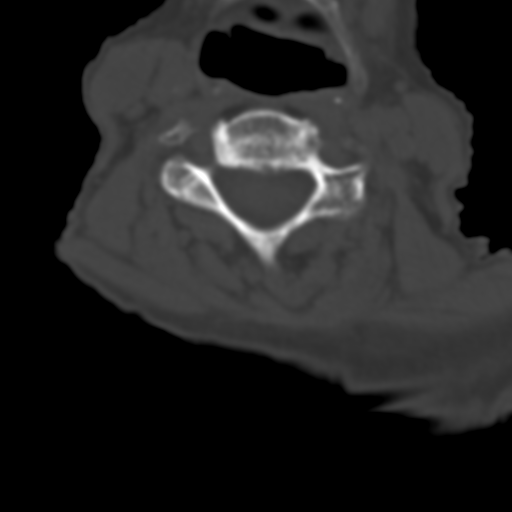
[im 57/80  soft-tissue]
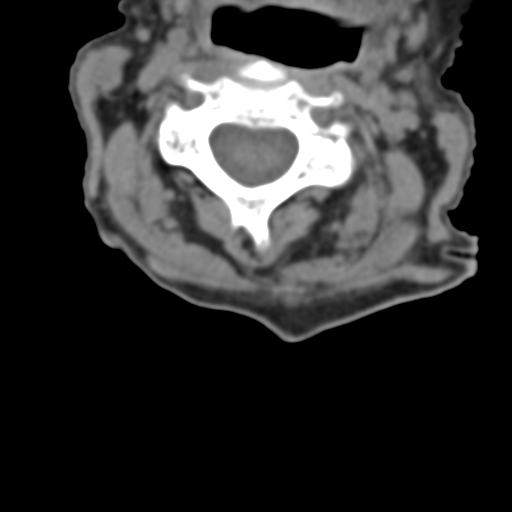
[im 57/80  bone]
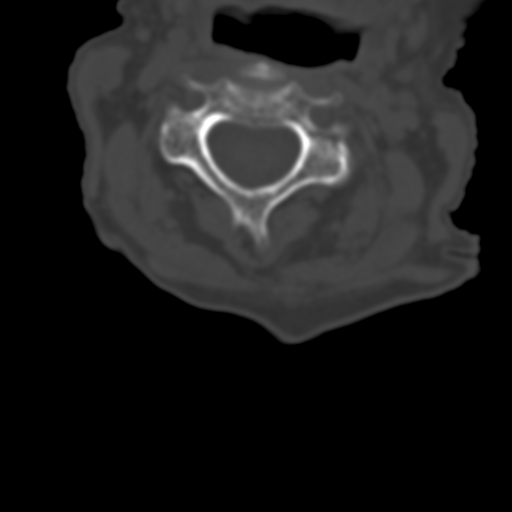
[im 68/80  bone]
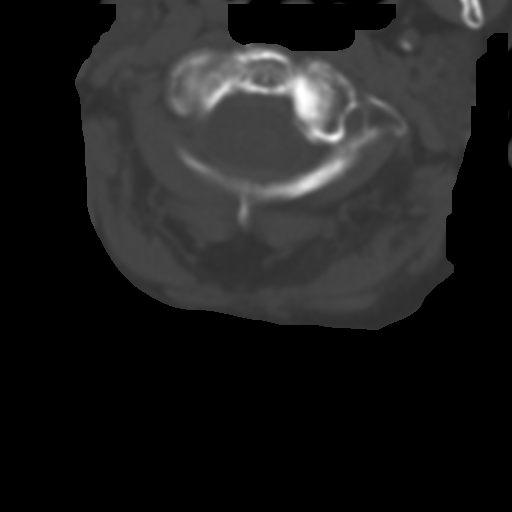

[6 of 14 positions shown; findings below may reference images not displayed]

FINDINGS: CT HEAD FINDINGS

Prominence of the sulci, cisterns, and ventricles, in keeping with
volume loss. No overt hydrocephalus. No definite CT evidence of an
acute infarction. Periventricular and subcortical white matter
hypodensities, a nonspecific finding often seen in the setting of
chronic microangiopathic change. No intraparenchymal hemorrhage,
mass, mass effect, or abnormal extra-axial fluid collection.
Atherosclerotic vascular calcifications. Visualized paranasal
sinuses and mastoid air cells are predominantly clear. Lens
replacements. No displaced calvarial fracture.

CT CERVICAL SPINE FINDINGS

Diffuse osteopenia. Maintained craniocervical relationship. No dens
fracture. Minimal retrolisthesis of C5 on C6. Disc osteophyte
complexes at C4-5, C5-6, and C6-7 result in mild-to-moderate central
canal narrowing. There is a 1.8 cm metallic ring within the upper
esophagus, resulting in significant streak artifact at the level of
the C7 vertebral body. Mild apical scarring.
IMPRESSION: Volume loss and white matter changes as above. No definite CT
evidence of an acute intracranial abnormality

Mild multilevel degenerative changes. No acute osseous finding of
the cervical spine.

Metallic ring within the upper esophagus may reflect an ingested
foreign body. Correlate clinically.

## 2015-12-10 DEATH — deceased
